# Patient Record
Sex: Female | Born: 1945 | Race: White | Hispanic: No | Marital: Single | Smoking: Never smoker
Health system: Southern US, Community
[De-identification: ages and names within clinical notes are randomized; demographics above are authoritative.]

## PROBLEM LIST (undated history)

## (undated) DIAGNOSIS — E039 Hypothyroidism, unspecified: Secondary | ICD-10-CM

## (undated) DIAGNOSIS — I10 Essential (primary) hypertension: Secondary | ICD-10-CM

## (undated) DIAGNOSIS — C801 Malignant (primary) neoplasm, unspecified: Secondary | ICD-10-CM

## (undated) HISTORY — PX: TONSILLECTOMY: SUR1361

## (undated) HISTORY — PX: OTHER SURGICAL HISTORY: SHX169

## (undated) HISTORY — PX: ABDOMINAL HYSTERECTOMY: SHX81

---

## 1988-11-03 HISTORY — PX: CHOLECYSTECTOMY: SHX55

## 1998-05-30 ENCOUNTER — Other Ambulatory Visit: Admission: RE | Admit: 1998-05-30 | Discharge: 1998-05-30 | Payer: Self-pay | Admitting: *Deleted

## 1998-06-04 ENCOUNTER — Inpatient Hospital Stay (HOSPITAL_COMMUNITY): Admission: RE | Admit: 1998-06-04 | Discharge: 1998-06-07 | Payer: Self-pay | Admitting: *Deleted

## 1998-10-03 ENCOUNTER — Other Ambulatory Visit: Admission: RE | Admit: 1998-10-03 | Discharge: 1998-10-03 | Payer: Self-pay | Admitting: *Deleted

## 1999-01-21 ENCOUNTER — Other Ambulatory Visit: Admission: RE | Admit: 1999-01-21 | Discharge: 1999-01-21 | Payer: Self-pay | Admitting: *Deleted

## 1999-04-11 ENCOUNTER — Other Ambulatory Visit: Admission: RE | Admit: 1999-04-11 | Discharge: 1999-04-11 | Payer: Self-pay | Admitting: *Deleted

## 1999-08-09 ENCOUNTER — Other Ambulatory Visit: Admission: RE | Admit: 1999-08-09 | Discharge: 1999-08-09 | Payer: Self-pay | Admitting: *Deleted

## 1999-09-10 ENCOUNTER — Encounter: Payer: Self-pay | Admitting: General Surgery

## 1999-09-12 ENCOUNTER — Observation Stay (HOSPITAL_COMMUNITY): Admission: RE | Admit: 1999-09-12 | Discharge: 1999-09-14 | Payer: Self-pay | Admitting: General Surgery

## 1999-12-25 ENCOUNTER — Other Ambulatory Visit: Admission: RE | Admit: 1999-12-25 | Discharge: 1999-12-25 | Payer: Self-pay | Admitting: *Deleted

## 2000-04-16 ENCOUNTER — Other Ambulatory Visit: Admission: RE | Admit: 2000-04-16 | Discharge: 2000-04-16 | Payer: Self-pay | Admitting: *Deleted

## 2000-05-25 ENCOUNTER — Encounter: Admission: RE | Admit: 2000-05-25 | Discharge: 2000-05-25 | Payer: Self-pay | Admitting: *Deleted

## 2000-08-20 ENCOUNTER — Other Ambulatory Visit: Admission: RE | Admit: 2000-08-20 | Discharge: 2000-08-20 | Payer: Self-pay | Admitting: *Deleted

## 2001-02-25 ENCOUNTER — Other Ambulatory Visit: Admission: RE | Admit: 2001-02-25 | Discharge: 2001-02-25 | Payer: Self-pay | Admitting: *Deleted

## 2001-05-26 ENCOUNTER — Encounter: Admission: RE | Admit: 2001-05-26 | Discharge: 2001-05-26 | Payer: Self-pay | Admitting: *Deleted

## 2001-08-30 ENCOUNTER — Other Ambulatory Visit: Admission: RE | Admit: 2001-08-30 | Discharge: 2001-08-30 | Payer: Self-pay | Admitting: *Deleted

## 2001-12-14 ENCOUNTER — Ambulatory Visit (HOSPITAL_COMMUNITY): Admission: RE | Admit: 2001-12-14 | Discharge: 2001-12-14 | Payer: Self-pay | Admitting: Family Medicine

## 2001-12-14 ENCOUNTER — Encounter: Payer: Self-pay | Admitting: Family Medicine

## 2002-02-23 ENCOUNTER — Other Ambulatory Visit: Admission: RE | Admit: 2002-02-23 | Discharge: 2002-02-23 | Payer: Self-pay | Admitting: *Deleted

## 2002-05-30 ENCOUNTER — Encounter: Admission: RE | Admit: 2002-05-30 | Discharge: 2002-05-30 | Payer: Self-pay | Admitting: *Deleted

## 2002-09-03 ENCOUNTER — Emergency Department (HOSPITAL_COMMUNITY): Admission: EM | Admit: 2002-09-03 | Discharge: 2002-09-03 | Payer: Self-pay | Admitting: Emergency Medicine

## 2002-09-03 ENCOUNTER — Encounter: Payer: Self-pay | Admitting: Internal Medicine

## 2002-09-12 ENCOUNTER — Ambulatory Visit (HOSPITAL_COMMUNITY): Admission: RE | Admit: 2002-09-12 | Discharge: 2002-09-12 | Payer: Self-pay | Admitting: Family Medicine

## 2002-09-12 ENCOUNTER — Encounter: Payer: Self-pay | Admitting: Family Medicine

## 2003-03-02 ENCOUNTER — Other Ambulatory Visit: Admission: RE | Admit: 2003-03-02 | Discharge: 2003-03-02 | Payer: Self-pay | Admitting: *Deleted

## 2003-06-01 ENCOUNTER — Encounter: Admission: RE | Admit: 2003-06-01 | Discharge: 2003-06-01 | Payer: Self-pay | Admitting: *Deleted

## 2003-12-08 ENCOUNTER — Emergency Department (HOSPITAL_COMMUNITY): Admission: EM | Admit: 2003-12-08 | Discharge: 2003-12-08 | Payer: Self-pay | Admitting: Emergency Medicine

## 2003-12-25 ENCOUNTER — Ambulatory Visit (HOSPITAL_COMMUNITY): Admission: RE | Admit: 2003-12-25 | Discharge: 2003-12-25 | Payer: Self-pay | Admitting: Internal Medicine

## 2004-03-05 ENCOUNTER — Other Ambulatory Visit: Admission: RE | Admit: 2004-03-05 | Discharge: 2004-03-05 | Payer: Self-pay | Admitting: *Deleted

## 2004-04-04 ENCOUNTER — Encounter: Admission: RE | Admit: 2004-04-04 | Discharge: 2004-04-04 | Payer: Self-pay | Admitting: General Surgery

## 2004-06-04 ENCOUNTER — Encounter: Admission: RE | Admit: 2004-06-04 | Discharge: 2004-06-04 | Payer: Self-pay | Admitting: *Deleted

## 2005-03-28 ENCOUNTER — Ambulatory Visit (HOSPITAL_COMMUNITY): Admission: RE | Admit: 2005-03-28 | Discharge: 2005-03-28 | Payer: Self-pay | Admitting: Family Medicine

## 2005-06-10 ENCOUNTER — Encounter: Admission: RE | Admit: 2005-06-10 | Discharge: 2005-06-10 | Payer: Self-pay | Admitting: *Deleted

## 2006-06-12 ENCOUNTER — Encounter: Admission: RE | Admit: 2006-06-12 | Discharge: 2006-06-12 | Payer: Self-pay | Admitting: Family Medicine

## 2006-11-09 ENCOUNTER — Ambulatory Visit (HOSPITAL_COMMUNITY): Admission: RE | Admit: 2006-11-09 | Discharge: 2006-11-09 | Payer: Self-pay | Admitting: Family Medicine

## 2007-06-14 ENCOUNTER — Encounter: Admission: RE | Admit: 2007-06-14 | Discharge: 2007-06-14 | Payer: Self-pay | Admitting: Family Medicine

## 2007-07-16 ENCOUNTER — Ambulatory Visit (HOSPITAL_COMMUNITY): Admission: RE | Admit: 2007-07-16 | Discharge: 2007-07-16 | Payer: Self-pay | Admitting: Family Medicine

## 2007-10-19 ENCOUNTER — Ambulatory Visit (HOSPITAL_COMMUNITY): Admission: RE | Admit: 2007-10-19 | Discharge: 2007-10-19 | Payer: Self-pay | Admitting: Family Medicine

## 2008-06-14 ENCOUNTER — Encounter: Admission: RE | Admit: 2008-06-14 | Discharge: 2008-06-14 | Payer: Self-pay | Admitting: Family Medicine

## 2008-07-18 ENCOUNTER — Ambulatory Visit (HOSPITAL_COMMUNITY): Admission: RE | Admit: 2008-07-18 | Discharge: 2008-07-18 | Payer: Self-pay | Admitting: Family Medicine

## 2009-01-11 ENCOUNTER — Encounter: Admission: RE | Admit: 2009-01-11 | Discharge: 2009-01-11 | Payer: Self-pay | Admitting: Orthopedic Surgery

## 2009-03-26 ENCOUNTER — Ambulatory Visit (HOSPITAL_COMMUNITY): Admission: RE | Admit: 2009-03-26 | Discharge: 2009-03-26 | Payer: Self-pay | Admitting: Family Medicine

## 2009-03-30 ENCOUNTER — Ambulatory Visit (HOSPITAL_COMMUNITY): Admission: RE | Admit: 2009-03-30 | Discharge: 2009-03-30 | Payer: Self-pay | Admitting: Family Medicine

## 2009-06-19 ENCOUNTER — Encounter: Admission: RE | Admit: 2009-06-19 | Discharge: 2009-06-19 | Payer: Self-pay | Admitting: Family Medicine

## 2010-07-04 ENCOUNTER — Encounter: Admission: RE | Admit: 2010-07-04 | Discharge: 2010-07-04 | Payer: Self-pay | Admitting: Family Medicine

## 2010-08-07 ENCOUNTER — Ambulatory Visit (HOSPITAL_COMMUNITY): Admission: RE | Admit: 2010-08-07 | Discharge: 2010-08-07 | Payer: Self-pay | Admitting: Family Medicine

## 2011-03-21 NOTE — Op Note (Signed)
NAME:  Megan Sutton, Megan Sutton                          ACCOUNT NO.:  [id number]   MEDICAL RECORD NO.:  [id number]                   PATIENT TYPE:  AMB   LOCATION:  DAY                                  FACILITY:  [facility]   PHYSICIAN:  Lionel December, M.D.                 DATE OF BIRTH:  03-25-1946   DATE OF PROCEDURE:  12/25/2003  DATE OF DISCHARGE:                                 OPERATIVE REPORT   PROCEDURE:  Total colonoscopy.   ENDOSCOPIST:  Lionel December, M.D.   INDICATIONS:  This patient is a 65 year old Caucasian female who is here for  screening colonoscopy.  Family history is negative for colorectal carcinoma.  The procedure and risks were reviewed with the patient and informed consent  was obtained.   PREOPERATIVE MEDICATIONS:  Demerol 50 mg IV and Versed 5 mg IV in divided  dose.   FINDINGS:  Procedure performed in endoscopy suite.  The patient's vital  signs and O2 saturation were monitored during the procedure and remained  stable.  The patient was placed in the left lateral recumbent position and  rectal examination was performed.  There was a small tag noted on digital  exam.   Olympus videoscope was placed in the rectum and advanced under vision into  the sigmoid colon and beyond.  Preparation was satisfactory.  Scope was  passed into the cecum which was identified by appendiceal orifice/stump and  ileocecal valve.  Pictures were taken for the record.  As the scope was  withdrawn colonic mucosa was carefully examined.  There was a single small  diverticulum at splenic flexure.  The rest of the mucosa was normal.  Rectal  mucosa similarly was normal.   The scope was retroflexed to examine the anorectal junction and she had 1  anal papilla which was also palpated on digital examination.  Endoscope was  straightened and withdrawn.  The patient tolerated the procedure well.   FINAL DIAGNOSIS:  A single small diverticulum at splenic flexure and  prominent anal papilla,  otherwise normal colonoscopy.   RECOMMENDATIONS:  1. High fiber diet.  2. Yearly Hemoccults.  3. She may consider next screening exam in 10 years from now.      ___________________________________________                                            Lionel December, M.D.   NR/MEDQ  D:  12/25/2003  T:  12/25/2003  Job:  [id number]   cc:   Patrica Duel, M.D.  [address], [address]  [place]  [place] [address]  Fax: [phone]

## 2011-06-23 ENCOUNTER — Other Ambulatory Visit: Payer: Self-pay | Admitting: Physician Assistant

## 2011-06-23 DIAGNOSIS — Z1231 Encounter for screening mammogram for malignant neoplasm of breast: Secondary | ICD-10-CM

## 2011-07-08 ENCOUNTER — Ambulatory Visit
Admission: RE | Admit: 2011-07-08 | Discharge: 2011-07-08 | Disposition: A | Discharge status: Home / Self Care | Payer: PRIVATE HEALTH INSURANCE | Source: Ambulatory Visit | Attending: Physician Assistant | Admitting: Physician Assistant

## 2011-07-08 DIAGNOSIS — Z1231 Encounter for screening mammogram for malignant neoplasm of breast: Secondary | ICD-10-CM

## 2011-11-19 ENCOUNTER — Other Ambulatory Visit (HOSPITAL_COMMUNITY): Payer: Self-pay | Admitting: Physician Assistant

## 2011-11-19 ENCOUNTER — Ambulatory Visit (HOSPITAL_COMMUNITY)
Admission: RE | Admit: 2011-11-19 | Discharge: 2011-11-19 | Disposition: A | Discharge status: Home / Self Care | Payer: BC Managed Care – PPO | Source: Ambulatory Visit | Attending: Physician Assistant | Admitting: Physician Assistant

## 2011-11-19 DIAGNOSIS — E039 Hypothyroidism, unspecified: Secondary | ICD-10-CM

## 2011-11-19 DIAGNOSIS — E049 Nontoxic goiter, unspecified: Secondary | ICD-10-CM | POA: Insufficient documentation

## 2012-06-07 ENCOUNTER — Other Ambulatory Visit: Payer: Self-pay | Admitting: Family Medicine

## 2012-06-07 DIAGNOSIS — Z1231 Encounter for screening mammogram for malignant neoplasm of breast: Secondary | ICD-10-CM

## 2012-07-08 ENCOUNTER — Ambulatory Visit
Admission: RE | Admit: 2012-07-08 | Discharge: 2012-07-08 | Disposition: A | Discharge status: Home / Self Care | Payer: Medicare Other | Source: Ambulatory Visit | Attending: Family Medicine | Admitting: Family Medicine

## 2012-07-08 DIAGNOSIS — Z1231 Encounter for screening mammogram for malignant neoplasm of breast: Secondary | ICD-10-CM

## 2012-08-18 ENCOUNTER — Other Ambulatory Visit (HOSPITAL_COMMUNITY): Payer: Self-pay | Admitting: Physician Assistant

## 2012-08-18 DIAGNOSIS — Z01419 Encounter for gynecological examination (general) (routine) without abnormal findings: Secondary | ICD-10-CM

## 2012-08-18 DIAGNOSIS — Z139 Encounter for screening, unspecified: Secondary | ICD-10-CM

## 2012-08-19 ENCOUNTER — Ambulatory Visit (HOSPITAL_COMMUNITY)
Admission: RE | Admit: 2012-08-19 | Discharge: 2012-08-19 | Disposition: A | Discharge status: Home / Self Care | Payer: Medicare Other | Source: Ambulatory Visit | Attending: Physician Assistant | Admitting: Physician Assistant

## 2012-08-19 DIAGNOSIS — Z780 Asymptomatic menopausal state: Secondary | ICD-10-CM | POA: Insufficient documentation

## 2012-08-19 DIAGNOSIS — Z9079 Acquired absence of other genital organ(s): Secondary | ICD-10-CM | POA: Insufficient documentation

## 2012-08-19 DIAGNOSIS — Z139 Encounter for screening, unspecified: Secondary | ICD-10-CM

## 2012-08-19 DIAGNOSIS — Z01419 Encounter for gynecological examination (general) (routine) without abnormal findings: Secondary | ICD-10-CM | POA: Insufficient documentation

## 2012-09-06 IMAGING — US US SOFT TISSUE HEAD/NECK
1 series · 14 of 25 positions shown · non-contrast
Comparison: None.

CLINICAL DATA: Palpable thyroid nodule on the right.

THYROID ULTRASOUND
TECHNIQUE: Ultrasound examination of the thyroid gland and adjacent
soft tissues was performed.

[Series 1: us soft tissue head/neck · 0.07mm/px · 14 of 32 slices shown]
[im 1/32]
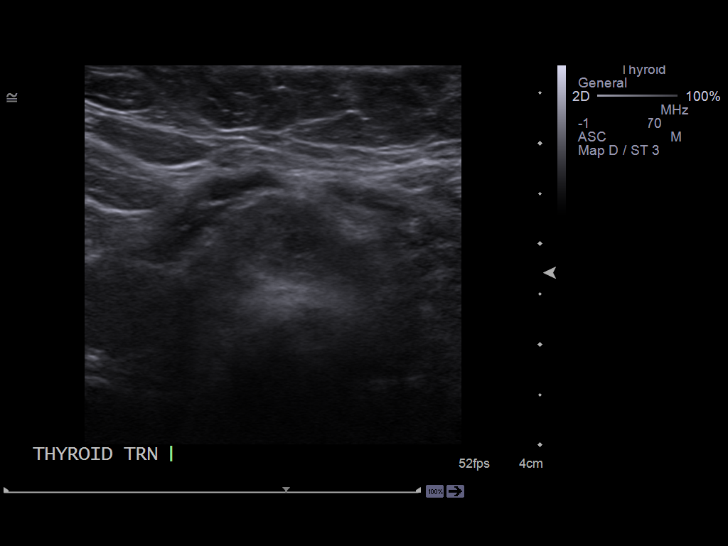
[im 3/32]
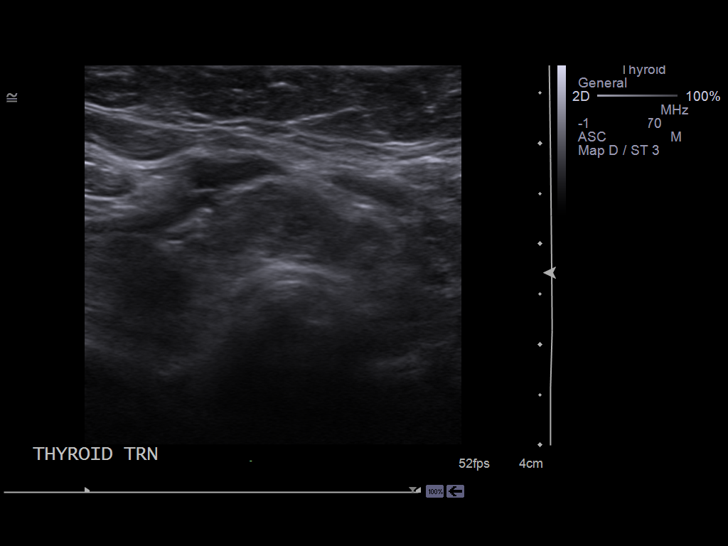
[im 6/32]
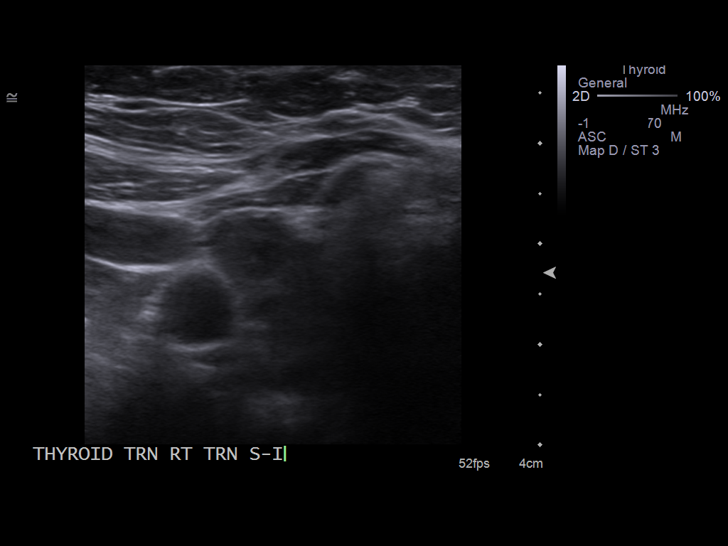
[im 8/32]
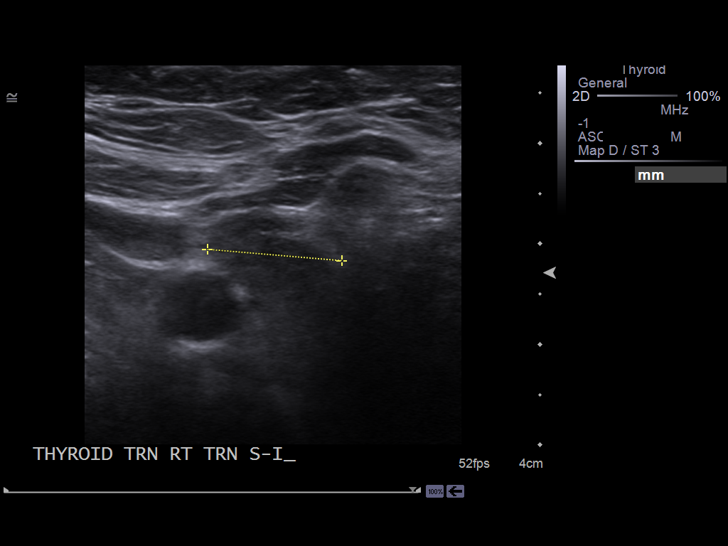
[im 11/32]
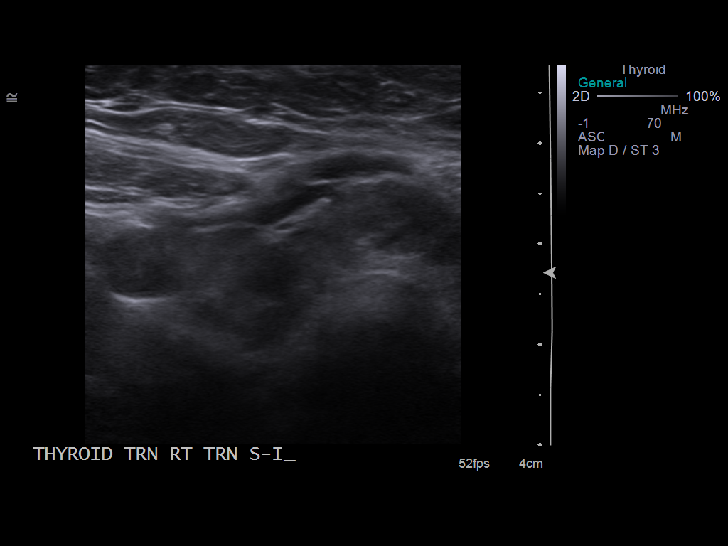
[im 12/32]
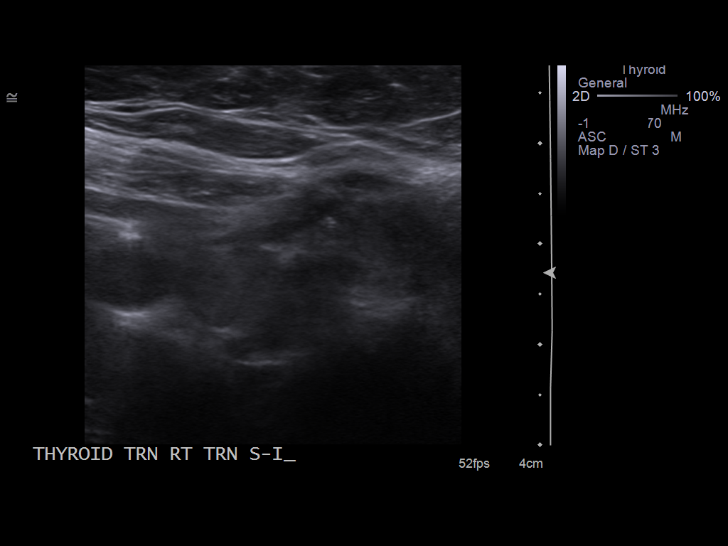
[im 15/32]
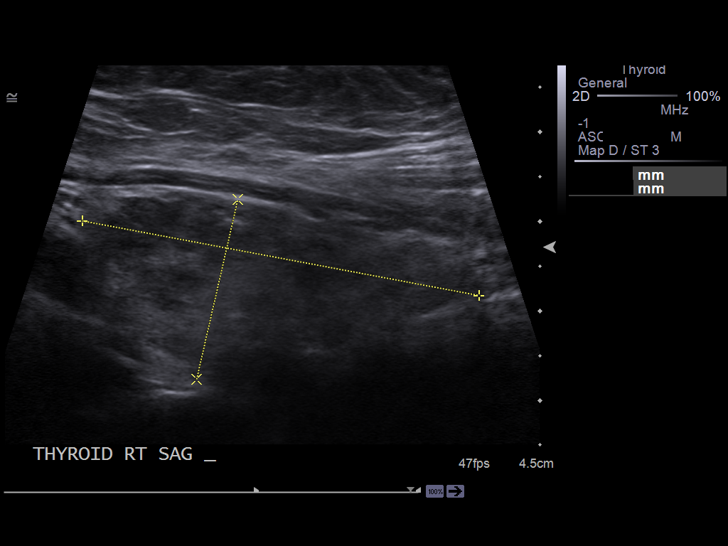
[im 17/32]
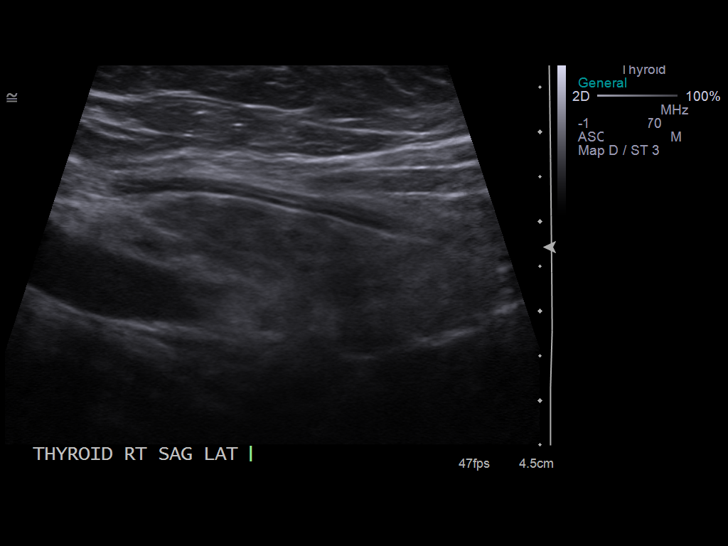
[im 20/32]
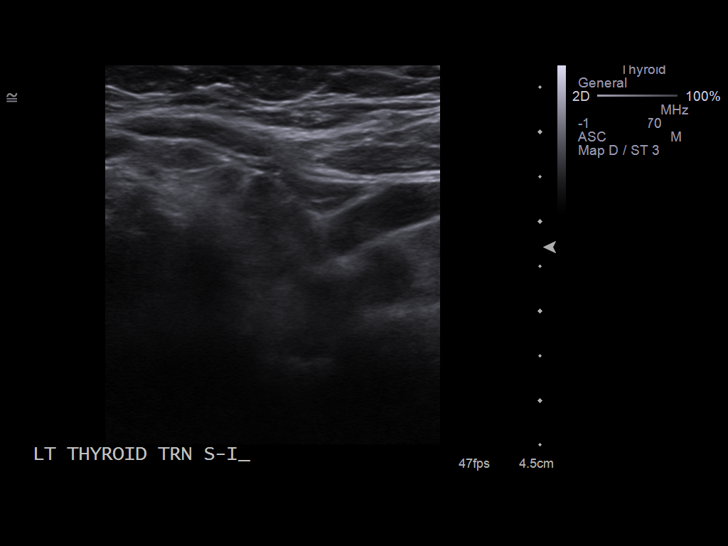
[im 21/32]
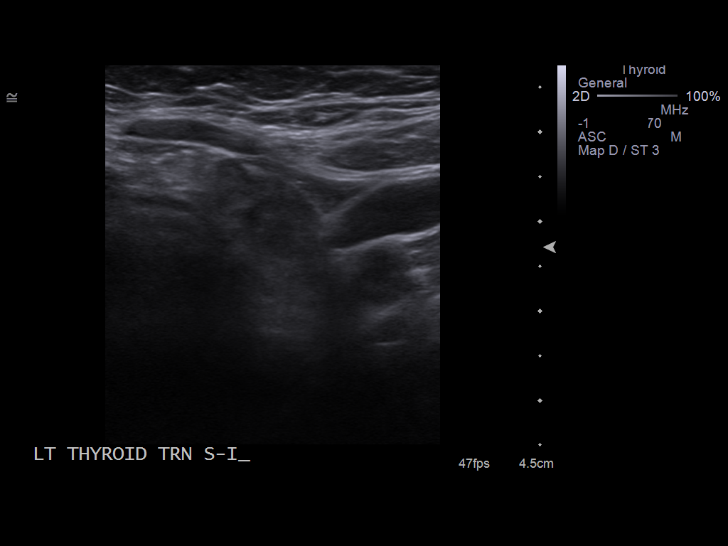
[im 24/32]
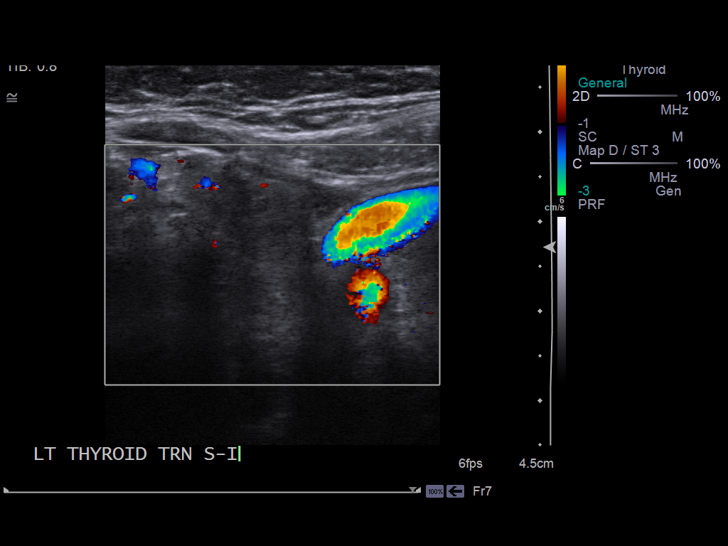
[im 26/32]
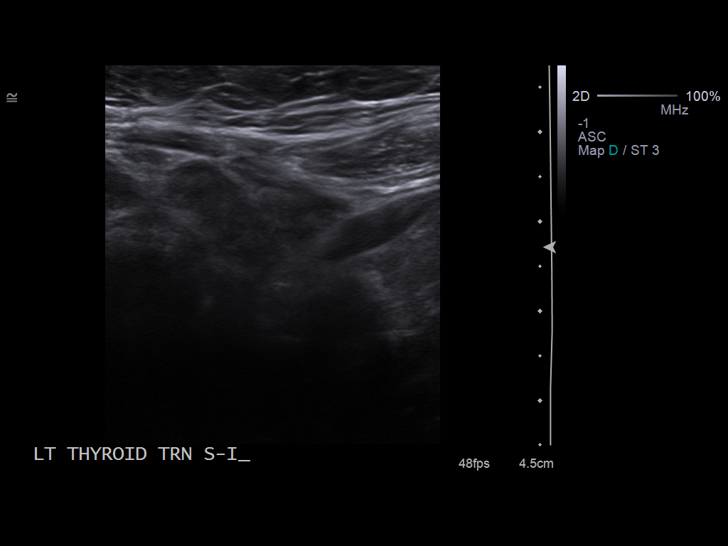
[im 29/32]
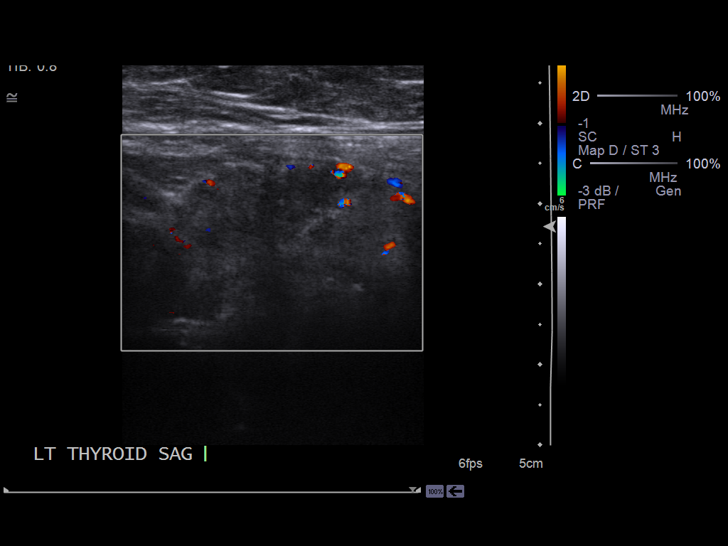
[im 32/32]
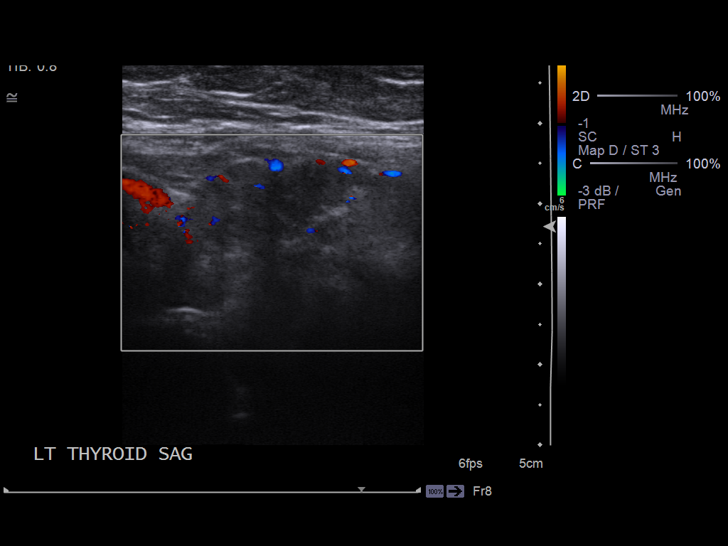

[14 of 25 positions shown; findings below may reference images not displayed]

FINDINGS: Right thyroid lobe:  Measures 4.5 x 2.1 x 1.3 cm.
Left thyroid lobe:  Measures 3.8 x 1.9 in by 2.0 cm.
Isthmus:  Measures 0.9 cm in thickness.

Focal nodules:  None.  The thyroid gland is diffusely and markedly
heterogeneous.

Lymphadenopathy:  None visualized.
IMPRESSION: Negative for focal nodule.  The thyroid is markedly heterogeneous.

## 2013-07-08 ENCOUNTER — Other Ambulatory Visit: Payer: Self-pay

## 2013-07-08 DIAGNOSIS — Z1231 Encounter for screening mammogram for malignant neoplasm of breast: Secondary | ICD-10-CM

## 2013-08-01 ENCOUNTER — Ambulatory Visit
Admission: RE | Admit: 2013-08-01 | Discharge: 2013-08-01 | Disposition: A | Discharge status: Home / Self Care | Payer: Medicare Other | Source: Ambulatory Visit

## 2013-08-01 DIAGNOSIS — Z1231 Encounter for screening mammogram for malignant neoplasm of breast: Secondary | ICD-10-CM

## 2014-06-26 ENCOUNTER — Other Ambulatory Visit: Payer: Self-pay

## 2014-06-26 DIAGNOSIS — Z1231 Encounter for screening mammogram for malignant neoplasm of breast: Secondary | ICD-10-CM

## 2014-08-02 ENCOUNTER — Ambulatory Visit
Admission: RE | Admit: 2014-08-02 | Discharge: 2014-08-02 | Disposition: A | Discharge status: Home / Self Care | Payer: Commercial Managed Care - HMO | Source: Ambulatory Visit

## 2014-08-02 DIAGNOSIS — Z1231 Encounter for screening mammogram for malignant neoplasm of breast: Secondary | ICD-10-CM

## 2016-01-31 DIAGNOSIS — H52223 Regular astigmatism, bilateral: Secondary | ICD-10-CM | POA: Diagnosis not present

## 2016-01-31 DIAGNOSIS — H04123 Dry eye syndrome of bilateral lacrimal glands: Secondary | ICD-10-CM | POA: Diagnosis not present

## 2016-01-31 DIAGNOSIS — H25813 Combined forms of age-related cataract, bilateral: Secondary | ICD-10-CM | POA: Diagnosis not present

## 2016-01-31 DIAGNOSIS — H5211 Myopia, right eye: Secondary | ICD-10-CM | POA: Diagnosis not present

## 2016-04-03 DIAGNOSIS — L138 Other specified bullous disorders: Secondary | ICD-10-CM | POA: Diagnosis not present

## 2016-04-03 DIAGNOSIS — Z79899 Other long term (current) drug therapy: Secondary | ICD-10-CM | POA: Diagnosis not present

## 2016-04-08 ENCOUNTER — Other Ambulatory Visit: Payer: Self-pay | Admitting: Family Medicine

## 2016-04-08 DIAGNOSIS — Z1231 Encounter for screening mammogram for malignant neoplasm of breast: Secondary | ICD-10-CM

## 2016-04-21 ENCOUNTER — Ambulatory Visit
Admission: RE | Admit: 2016-04-21 | Discharge: 2016-04-21 | Disposition: A | Discharge status: Home / Self Care | Payer: PPO | Source: Ambulatory Visit | Attending: Family Medicine | Admitting: Family Medicine

## 2016-04-21 DIAGNOSIS — Z1231 Encounter for screening mammogram for malignant neoplasm of breast: Secondary | ICD-10-CM | POA: Diagnosis not present

## 2016-04-22 ENCOUNTER — Telehealth: Payer: Self-pay

## 2016-04-22 NOTE — Telephone Encounter (Signed)
PATIENT DUE FOR 10 YR TCS   LAST ONE DONE 10 YRS AGO WITH REHMAN.  WANTS DR Gala Romney.  PLEASE CALL, [phone]

## 2016-04-24 ENCOUNTER — Telehealth: Payer: Self-pay

## 2016-04-24 DIAGNOSIS — L138 Other specified bullous disorders: Secondary | ICD-10-CM | POA: Diagnosis not present

## 2016-04-24 DIAGNOSIS — Z79899 Other long term (current) drug therapy: Secondary | ICD-10-CM | POA: Diagnosis not present

## 2016-04-24 NOTE — Telephone Encounter (Signed)
See separate triage.  

## 2016-04-24 NOTE — Telephone Encounter (Signed)
LMOM for a return call.  

## 2016-04-24 NOTE — Telephone Encounter (Signed)
Gastroenterology Pre-Procedure Review  Request Date: 04/24/2016 Requesting Physician: ON RECALL FOR 2015  PATIENT REVIEW QUESTIONS: The patient responded to the following health history questions as indicated:    Pt's last colonoscopy was in 2005 by Dr. Laural Golden and pt was sent a reminder letter in 11/2013 She would like to stay with our office and have Dr. Gala Romney do her colonoscopy  1. Diabetes Melitis: no 2. Joint replacements in the past 12 months: no 3. Major health problems in the past 3 months: no 4. Has an artificial valve or MVP: no 5. Has a defibrillator: no 6. Has been advised in past to take antibiotics in advance of a procedure like teeth cleaning: no 7. Family history of colon cancer: no  8. Alcohol Use: no 9. History of sleep apnea: no     MEDICATIONS & ALLERGIES:    Patient reports the following regarding taking any blood thinners:   Plavix? no Aspirin? no Coumadin? no  Patient confirms/reports the following medications:  Current Outpatient Prescriptions  Medication Sig Dispense Refill  . levothyroxine (SYNTHROID, LEVOTHROID) 75 MCG tablet Take 75 mcg by mouth daily before breakfast.    . polyethylene glycol (MIRALAX / GLYCOLAX) packet Take 17 g by mouth daily. Pt takes a dose every other day and is doing well with her bowel movements    . ramipril (ALTACE) 5 MG capsule Take 5 mg by mouth daily.     No current facility-administered medications for this visit.    Patient confirms/reports the following allergies:  No Known Allergies  No orders of the defined types were placed in this encounter.    AUTHORIZATION INFORMATION Primary Insurance:   ID #:   Group #:  Pre-Cert / Auth required:  Pre-Cert / Auth #:   Secondary Insurance:   ID #:  Group #:  Pre-Cert / Auth required: Pre-Cert / Auth #:   SCHEDULE INFORMATION: Procedure has been scheduled as follows:  Date:  05/13/2016            Time:  11:30 AM Location: [facility] Short Stay  This  Gastroenterology Pre-Precedure Review Form is being routed to the following provider(s): R. Garfield Cornea, MD

## 2016-04-24 NOTE — Telephone Encounter (Signed)
Ok to schedule.

## 2016-04-29 DIAGNOSIS — Z6841 Body Mass Index (BMI) 40.0 and over, adult: Secondary | ICD-10-CM | POA: Diagnosis not present

## 2016-04-29 DIAGNOSIS — M5410 Radiculopathy, site unspecified: Secondary | ICD-10-CM | POA: Diagnosis not present

## 2016-04-29 DIAGNOSIS — Z1389 Encounter for screening for other disorder: Secondary | ICD-10-CM | POA: Diagnosis not present

## 2016-04-29 DIAGNOSIS — M545 Low back pain: Secondary | ICD-10-CM | POA: Diagnosis not present

## 2016-04-30 ENCOUNTER — Other Ambulatory Visit: Payer: Self-pay

## 2016-04-30 DIAGNOSIS — Z1211 Encounter for screening for malignant neoplasm of colon: Secondary | ICD-10-CM

## 2016-04-30 MED ORDER — PEG 3350-KCL-NA BICARB-NACL 420 G PO SOLR: 4000.0000 mL | ORAL | Status: AC

## 2016-04-30 NOTE — Telephone Encounter (Signed)
Rx sent to the pharmacy and instructions mailed to pt.  

## 2016-05-01 ENCOUNTER — Telehealth: Payer: Self-pay

## 2016-05-01 DIAGNOSIS — L138 Other specified bullous disorders: Secondary | ICD-10-CM | POA: Diagnosis not present

## 2016-05-01 DIAGNOSIS — Z79899 Other long term (current) drug therapy: Secondary | ICD-10-CM | POA: Diagnosis not present

## 2016-05-01 NOTE — Telephone Encounter (Signed)
Paperwork to be scanned in epic for the NO PA REQUIRED FOR THE SCREENING COLONOSCOPY.

## 2016-05-08 DIAGNOSIS — L138 Other specified bullous disorders: Secondary | ICD-10-CM | POA: Diagnosis not present

## 2016-05-08 DIAGNOSIS — Z79899 Other long term (current) drug therapy: Secondary | ICD-10-CM | POA: Diagnosis not present

## 2016-05-13 ENCOUNTER — Encounter (HOSPITAL_COMMUNITY): Payer: Self-pay | Admitting: *Deleted

## 2016-05-13 ENCOUNTER — Encounter (HOSPITAL_COMMUNITY)
Admission: RE | Disposition: A | Discharge status: Home / Self Care | Payer: Self-pay | Source: Ambulatory Visit | Attending: Internal Medicine

## 2016-05-13 ENCOUNTER — Ambulatory Visit (HOSPITAL_COMMUNITY)
Admission: RE | Admit: 2016-05-13 | Discharge: 2016-05-13 | Disposition: A | Discharge status: Home / Self Care | Payer: PPO | Source: Ambulatory Visit | Attending: Internal Medicine | Admitting: Internal Medicine

## 2016-05-13 DIAGNOSIS — I10 Essential (primary) hypertension: Secondary | ICD-10-CM | POA: Diagnosis not present

## 2016-05-13 DIAGNOSIS — Z8542 Personal history of malignant neoplasm of other parts of uterus: Secondary | ICD-10-CM | POA: Diagnosis not present

## 2016-05-13 DIAGNOSIS — E039 Hypothyroidism, unspecified: Secondary | ICD-10-CM | POA: Insufficient documentation

## 2016-05-13 DIAGNOSIS — K5730 Diverticulosis of large intestine without perforation or abscess without bleeding: Secondary | ICD-10-CM | POA: Diagnosis not present

## 2016-05-13 DIAGNOSIS — Z1211 Encounter for screening for malignant neoplasm of colon: Secondary | ICD-10-CM | POA: Diagnosis not present

## 2016-05-13 DIAGNOSIS — Z79899 Other long term (current) drug therapy: Secondary | ICD-10-CM | POA: Insufficient documentation

## 2016-05-13 HISTORY — DX: Hypothyroidism, unspecified: E03.9

## 2016-05-13 HISTORY — PX: COLONOSCOPY: SHX5424

## 2016-05-13 HISTORY — DX: Malignant (primary) neoplasm, unspecified: C80.1

## 2016-05-13 HISTORY — DX: Essential (primary) hypertension: I10

## 2016-05-13 SURGERY — COLONOSCOPY
Anesthesia: Moderate Sedation

## 2016-05-13 MED ORDER — MIDAZOLAM HCL 5 MG/5ML IJ SOLN
INTRAMUSCULAR | Status: DC | PRN
  Administered 2016-05-13: 1 mg via INTRAVENOUS
  Administered 2016-05-13: 2 mg via INTRAVENOUS
  Administered 2016-05-13: 0.5 mg via INTRAVENOUS
  Administered 2016-05-13: 2 mg via INTRAVENOUS

## 2016-05-13 MED ORDER — MEPERIDINE HCL 100 MG/ML IJ SOLN
INTRAMUSCULAR | Status: DC
Start: 2016-05-13 — End: 2016-05-13
  Filled 2016-05-13: qty 2

## 2016-05-13 MED ORDER — MIDAZOLAM HCL 5 MG/5ML IJ SOLN
INTRAMUSCULAR | Status: AC
  Filled 2016-05-13: qty 10

## 2016-05-13 MED ORDER — ONDANSETRON HCL 4 MG/2ML IJ SOLN
INTRAMUSCULAR | Status: AC
  Filled 2016-05-13: qty 2

## 2016-05-13 MED ORDER — SODIUM CHLORIDE 0.9 % IV SOLN
INTRAVENOUS | Status: DC
  Administered 2016-05-13: 11:00:00 via INTRAVENOUS

## 2016-05-13 MED ORDER — MEPERIDINE HCL 100 MG/ML IJ SOLN
INTRAMUSCULAR | Status: DC | PRN
  Administered 2016-05-13 (×2): 50 mg via INTRAVENOUS
  Administered 2016-05-13: 25 mg via INTRAVENOUS

## 2016-05-13 MED ORDER — ONDANSETRON HCL 4 MG/2ML IJ SOLN
INTRAMUSCULAR | Status: DC | PRN
  Administered 2016-05-13: 4 mg via INTRAVENOUS

## 2016-05-13 NOTE — Discharge Instructions (Signed)
Colonoscopy Discharge Instructions  Read the instructions outlined below and refer to this sheet in the next few weeks. These discharge instructions provide you with general information on caring for yourself after you leave the hospital. Your doctor may also give you specific instructions. While your treatment has been planned according to the most current medical practices available, unavoidable complications occasionally occur. If you have any problems or questions after discharge, call Dr. Gala Romney at [phone]. ACTIVITY  You may resume your regular activity, but move at a slower pace for the next 24 hours.   Take frequent rest periods for the next 24 hours.   Walking will help get rid of the air and reduce the bloated feeling in your belly (abdomen).   No driving for 24 hours (because of the medicine (anesthesia) used during the test).    Do not sign any important legal documents or operate any machinery for 24 hours (because of the anesthesia used during the test).  NUTRITION  Drink plenty of fluids.   You may resume your normal diet as instructed by your doctor.   Begin with a light meal and progress to your normal diet. Heavy or fried foods are harder to digest and may make you feel sick to your stomach (nauseated).   Avoid alcoholic beverages for 24 hours or as instructed.  MEDICATIONS  You may resume your normal medications unless your doctor tells you otherwise.  WHAT YOU CAN EXPECT TODAY  Some feelings of bloating in the abdomen.   Passage of more gas than usual.   Spotting of blood in your stool or on the toilet paper.  IF YOU HAD POLYPS REMOVED DURING THE COLONOSCOPY:  No aspirin products for 7 days or as instructed.   No alcohol for 7 days or as instructed.   Eat a soft diet for the next 24 hours.  FINDING OUT THE RESULTS OF YOUR TEST Not all test results are available during your visit. If your test results are not back during the visit, make an appointment  with your caregiver to find out the results. Do not assume everything is normal if you have not heard from your caregiver or the medical facility. It is important for you to follow up on all of your test results.  SEEK IMMEDIATE MEDICAL ATTENTION IF:  You have more than a spotting of blood in your stool.   Your belly is swollen (abdominal distention).   You are nauseated or vomiting.   You have a temperature over 101.   You have abdominal pain or discomfort that is severe or gets worse throughout the day.    Diverticulosis information provided  1 more screening colonoscopy in 10 years only of overall health permits Diverticulosis Diverticulosis is the condition that develops when small pouches (diverticula) form in the wall of your colon. Your colon, or large intestine, is where water is absorbed and stool is formed. The pouches form when the inside layer of your colon pushes through weak spots in the outer layers of your colon. CAUSES  No one knows exactly what causes diverticulosis. RISK FACTORS  Being older than 69. Your risk for this condition increases with age. Diverticulosis is rare in people younger than 40 years. By age 69, almost everyone has it.  Eating a low-fiber diet.  Being frequently constipated.  Being overweight.  Not getting enough exercise.  Smoking.  Taking over-the-counter pain medicines, like aspirin and ibuprofen. SYMPTOMS  Most people with diverticulosis do not have symptoms. DIAGNOSIS  Because  diverticulosis often has no symptoms, health care providers often discover the condition during an exam for other colon problems. In many cases, a health care provider will diagnose diverticulosis while using a flexible scope to examine the colon (colonoscopy). TREATMENT  If you have never developed an infection related to diverticulosis, you may not need treatment. If you have had an infection before, treatment may include:  Eating more fruits, vegetables,  and grains.  Taking a fiber supplement.  Taking a live bacteria supplement (probiotic).  Taking medicine to relax your colon. HOME CARE INSTRUCTIONS   Drink at least 6-8 glasses of water each day to prevent constipation.  Try not to strain when you have a bowel movement.  Keep all follow-up appointments. If you have had an infection before:  Increase the fiber in your diet as directed by your health care provider or dietitian.  Take a dietary fiber supplement if your health care provider approves.  Only take medicines as directed by your health care provider. SEEK MEDICAL CARE IF:   You have abdominal pain.  You have bloating.  You have cramps.  You have not gone to the bathroom in 3 days. SEEK IMMEDIATE MEDICAL CARE IF:   Your pain gets worse.  Yourbloating becomes very bad.  You have a fever or chills, and your symptoms suddenly get worse.  You begin vomiting.  You have bowel movements that are bloody or black. MAKE SURE YOU:  Understand these instructions.  Will watch your condition.  Will get help right away if you are not doing well or get worse.   This information is not intended to replace advice given to you by your health care provider. Make sure you discuss any questions you have with your health care provider.   Document Released: 07/17/2004 Document Revised: 10/25/2013 Document Reviewed: 09/14/2013 Elsevier Interactive Patient Education 2016 Elsevier Inc.  High-Fiber Diet Fiber, also called dietary fiber, is a type of carbohydrate found in fruits, vegetables, whole grains, and beans. A high-fiber diet can have many health benefits. Your health care provider may recommend a high-fiber diet to help:  Prevent constipation. Fiber can make your bowel movements more regular.  Lower your cholesterol.  Relieve hemorrhoids, uncomplicated diverticulosis, or irritable bowel syndrome.  Prevent overeating as part of a weight-loss plan.  Prevent heart  disease, type 2 diabetes, and certain cancers. WHAT IS MY PLAN? The recommended daily intake of fiber includes:  38 grams for men under age 63.  11 grams for men over age 54.  46 grams for women under age 81.  68 grams for women over age 1. You can get the recommended daily intake of dietary fiber by eating a variety of fruits, vegetables, grains, and beans. Your health care provider may also recommend a fiber supplement if it is not possible to get enough fiber through your diet. WHAT DO I NEED TO KNOW ABOUT A HIGH-FIBER DIET?  Fiber supplements have not been widely studied for their effectiveness, so it is better to get fiber through food sources.  Always check the fiber content on thenutrition facts label of any prepackaged food. Look for foods that contain at least 5 grams of fiber per serving.  Ask your dietitian if you have questions about specific foods that are related to your condition, especially if those foods are not listed in the following section.  Increase your daily fiber consumption gradually. Increasing your intake of dietary fiber too quickly may cause bloating, cramping, or gas.  Drink plenty of  water. Water helps you to digest fiber. WHAT FOODS CAN I EAT? Grains Whole-grain breads. Multigrain cereal. Oats and oatmeal. Brown rice. Barley. Bulgur wheat. [place]. Bran muffins. Popcorn. Rye wafer crackers. Vegetables Sweet potatoes. Spinach. Kale. Artichokes. Cabbage. Broccoli. Green peas. Carrots. Squash. Fruits Berries. Pears. Apples. Oranges. Avocados. Prunes and raisins. Dried figs. Meats and Other Protein Sources [place], kidney, pinto, and soy beans. Split peas. Lentils. Nuts and seeds. Dairy Fiber-fortified yogurt. Beverages Fiber-fortified soy milk. Fiber-fortified orange juice. Other Fiber bars. The items listed above may not be a complete list of recommended foods or beverages. Contact your dietitian for more options. WHAT FOODS ARE NOT  RECOMMENDED? Grains White bread. Pasta made with refined flour. White rice. Vegetables Fried potatoes. Canned vegetables. Well-cooked vegetables.  Fruits Fruit juice. Cooked, strained fruit. Meats and Other Protein Sources Fatty cuts of meat. Fried Sales executive or fried fish. Dairy Milk. Yogurt. Cream cheese. Sour cream. Beverages Soft drinks. Other Cakes and pastries. Butter and oils. The items listed above may not be a complete list of foods and beverages to avoid. Contact your dietitian for more information. WHAT ARE SOME TIPS FOR INCLUDING HIGH-FIBER FOODS IN MY DIET?  Eat a wide variety of high-fiber foods.  Make sure that half of all grains consumed each day are whole grains.  Replace breads and cereals made from refined flour or white flour with whole-grain breads and cereals.  Replace white rice with brown rice, bulgur wheat, or millet.  Start the day with a breakfast that is high in fiber, such as a cereal that contains at least 5 grams of fiber per serving.  Use beans in place of meat in soups, salads, or pasta.  Eat high-fiber snacks, such as berries, raw vegetables, nuts, or popcorn.   This information is not intended to replace advice given to you by your health care provider. Make sure you discuss any questions you have with your health care provider.   Document Released: 10/20/2005 Document Revised: 11/10/2014 Document Reviewed: 04/04/2014 Elsevier Interactive Patient Education [employer].

## 2016-05-13 NOTE — H&P (Signed)
@  [id number]   Primary Care Physician:  Purvis Kilts, MD Primary Gastroenterologist:  Dr. Gala Romney  Pre-Procedure History & Physical: HPI:  Megan Sutton is a 70 y.o. female is here for a screening colonoscopy. No bowel symptoms. Reported negative colonoscopy 2005-records unavailable. No family history of colon cancer.  Past Medical History  Diagnosis Date  . Hypertension   . Hypothyroidism   . Cancer [facility])     Uterine cancer    Past Surgical History  Procedure Laterality Date  . Abdominal hysterectomy    . Cholecystectomy  1990  . Tonsillectomy    . Left inguinal hernia repair      Prior to Admission medications   Medication Sig Start Date End Date Taking? Authorizing Provider  dapsone 25 MG tablet Take 25 mg by mouth daily.  04/17/16  Yes Historical Provider, MD  levothyroxine (SYNTHROID, LEVOTHROID) 75 MCG tablet Take 75 mcg by mouth daily before breakfast.   Yes Historical Provider, MD  polyethylene glycol (MIRALAX / GLYCOLAX) packet Take 17 g by mouth daily. Pt takes a dose every other day and is doing well with her bowel movements   Yes Historical Provider, MD  polyethylene glycol-electrolytes (TRILYTE) 420 g solution Take 4,000 mLs by mouth as directed. 04/30/16  Yes Daneil Dolin, MD  ramipril (ALTACE) 5 MG capsule Take 5 mg by mouth daily.   Yes Historical Provider, MD  clobetasol cream (TEMOVATE) [id number] % Apply 1 application topically 2 (two) times daily.  04/03/16   Historical Provider, MD    Allergies as of 04/30/2016  . (No Known Allergies)    Family History  Problem Relation Age of Onset  . Adopted: Yes    Social History   Social History  . Marital Status: Single    Spouse Name: N/A  . Number of Children: N/A  . Years of Education: N/A   Occupational History  . Not on file.   Social History Main Topics  . Smoking status: Never Smoker   . Smokeless tobacco: Not on file  . Alcohol Use: No  . Drug Use: No  . Sexual Activity: Not on file   Other  Topics Concern  . Not on file   Social History Narrative  . No narrative on file    Review of Systems: See HPI, otherwise negative ROS  Physical Exam: BP 136/98 mmHg  Pulse 86  Temp(Src) 97.5 F (36.4 C) (Oral)  Resp 18  Ht 5' 2.5" (1.588 m)  Wt 288 lb (130.636 kg)  BMI 51.80 kg/m2  SpO2 92% General:   Alert,  Well-developed, well-nourished, pleasant and cooperative in NAD Head:  Normocephalic and atraumatic. Lungs:  Clear throughout to auscultation.   No wheezes, crackles, or rhonchi. No acute distress. Heart:  Regular rate and rhythm; no murmurs, clicks, rubs,  or gallops. Abdomen:  Soft, nontender and nondistended. No masses, hepatosplenomegaly or hernias noted. Normal bowel sounds, without guarding, and without rebound.    Impression/Plan: Megan Sutton is now here to undergo a screening colonoscopy.  Average risk screening examination.  Risks, benefits, limitations, imponderables and alternatives regarding colonoscopy have been reviewed with the patient. Questions have been answered. All parties agreeable.     Notice:  This dictation was prepared with Dragon dictation along with smaller phrase technology. Any transcriptional errors that result from this process are unintentional and may not be corrected upon review.

## 2016-05-13 NOTE — Op Note (Signed)
[facility] Patient Name: Megan Sutton Procedure Date: 05/13/2016 10:29 AM MRN: [id number] Date of Birth: 08/05/1946 Attending MD: Norvel Richards , MD CSN: [id number] Age: 70 Admit Type: Outpatient Procedure:                Colonoscopy - screening Indications:              Screening for colorectal malignant neoplasm Providers:                Norvel Richards, MD, Lurline Del, RN, Randa Spike, Technician Referring MD:              Medicines:                Midazolam 6.5 mg IV, Meperidine 125 mg IV,                            Ondansetron 4 mg IV Complications:            No immediate complications. Estimated Blood Loss:     Estimated blood loss: none. Procedure:                Pre-Anesthesia Assessment:                           - Prior to the procedure, a History and Physical                            was performed, and patient medications and                            allergies were reviewed. The patient's tolerance of                            previous anesthesia was also reviewed. The risks                            and benefits of the procedure and the sedation                            options and risks were discussed with the patient.                            All questions were answered, and informed consent                            was obtained. Prior Anticoagulants: The patient has                            taken no previous anticoagulant or antiplatelet                            agents. ASA Grade Assessment: II - A patient with  mild systemic disease. After reviewing the risks                            and benefits, the patient was deemed in                            satisfactory condition to undergo the procedure.                           After obtaining informed consent, the colonoscope                            was passed under direct vision. Throughout the   procedure, the patient's blood pressure, pulse, and                            oxygen saturations were monitored continuously. The                            EC38-[id number]) scope was introduced through                            the anus and advanced to the the cecum, identified                            by appendiceal orifice and ileocecal valve. The                            colonoscopy was performed without difficulty. The                            patient tolerated the procedure well. The quality                            of the bowel preparation was adequate. The                            ileocecal valve, appendiceal orifice, and rectum                            were photographed. Scope In: 10:54:39 AM Scope Out: [id number] AM Scope Withdrawal Time: 0 hours 7 minutes 21 seconds  Total Procedure Duration: 0 hours 13 minutes 39 seconds  Findings:      The perianal and digital rectal examinations were normal.      Scattered medium-mouthed diverticula were found in the sigmoid colon and       descending colon.      The exam was otherwise without abnormality on direct and retroflexion       views. Impression:               - Diverticulosis in the sigmoid colon and in the                            descending colon.                           -  The examination was otherwise normal on direct                            and retroflexion views.                           - No specimens collected. Moderate Sedation:      Moderate (conscious) sedation was administered by the endoscopy nurse       and supervised by the endoscopist. The following parameters were       monitored: oxygen saturation, heart rate, blood pressure, respiratory       rate, EKG, adequacy of pulmonary ventilation, and response to care.       Total physician intraservice time was 23 minutes. Recommendation:           - Patient has a contact number available for                            emergencies. The signs and  symptoms of potential                            delayed complications were discussed with the                            patient. Return to normal activities tomorrow.                            Written discharge instructions were provided to the                            patient.                           - Advance diet as tolerated.                           - Continue present medications.                           - Repeat colonoscopy in 10 years for screening                            purposes only if overall health permits. Procedure Code(s):        --- Professional ---                           [phone], Colonoscopy, flexible; diagnostic, including                            collection of specimen(s) by brushing or washing,                            when performed (separate procedure)                           99152, Moderate sedation services provided by the  same physician or other qualified health care                            professional performing the diagnostic or                            therapeutic service that the sedation supports,                            requiring the presence of an independent trained                            observer to assist in the monitoring of the                            patient's level of consciousness and physiological                            status; initial 15 minutes of intraservice time,                            patient age 11 years or older                           [phone], Moderate sedation services; each additional                            15 minutes intraservice time Diagnosis Code(s):        --- Professional ---                           Z12.11, Encounter for screening for malignant                            neoplasm of colon                           K57.30, Diverticulosis of large intestine without                            perforation or abscess without bleeding CPT copyright 2016 American  Medical Association. All rights reserved. The codes documented in this report are preliminary and upon coder review may  be revised to meet current compliance requirements. [name], MD Norvel Richards, MD 05/13/2016 11:12:04 AM This report has been signed electronically. Number of Addenda: 0

## 2016-05-15 DIAGNOSIS — Z79899 Other long term (current) drug therapy: Secondary | ICD-10-CM | POA: Diagnosis not present

## 2016-05-15 DIAGNOSIS — L138 Other specified bullous disorders: Secondary | ICD-10-CM | POA: Diagnosis not present

## 2016-05-16 ENCOUNTER — Encounter (HOSPITAL_COMMUNITY): Payer: Self-pay | Admitting: Internal Medicine

## 2016-06-12 DIAGNOSIS — L138 Other specified bullous disorders: Secondary | ICD-10-CM | POA: Diagnosis not present

## 2016-06-12 DIAGNOSIS — Z79899 Other long term (current) drug therapy: Secondary | ICD-10-CM | POA: Diagnosis not present

## 2016-07-10 DIAGNOSIS — Z79899 Other long term (current) drug therapy: Secondary | ICD-10-CM | POA: Diagnosis not present

## 2016-07-10 DIAGNOSIS — L138 Other specified bullous disorders: Secondary | ICD-10-CM | POA: Diagnosis not present

## 2016-07-17 DIAGNOSIS — L138 Other specified bullous disorders: Secondary | ICD-10-CM | POA: Diagnosis not present

## 2016-08-22 DIAGNOSIS — Z23 Encounter for immunization: Secondary | ICD-10-CM | POA: Diagnosis not present

## 2016-10-29 DIAGNOSIS — E039 Hypothyroidism, unspecified: Secondary | ICD-10-CM | POA: Diagnosis not present

## 2016-10-29 DIAGNOSIS — I10 Essential (primary) hypertension: Secondary | ICD-10-CM | POA: Diagnosis not present

## 2016-10-29 DIAGNOSIS — Z0000 Encounter for general adult medical examination without abnormal findings: Secondary | ICD-10-CM | POA: Diagnosis not present

## 2016-10-29 DIAGNOSIS — Z1389 Encounter for screening for other disorder: Secondary | ICD-10-CM | POA: Diagnosis not present

## 2016-10-29 DIAGNOSIS — E669 Obesity, unspecified: Secondary | ICD-10-CM | POA: Diagnosis not present

## 2016-10-29 DIAGNOSIS — E782 Mixed hyperlipidemia: Secondary | ICD-10-CM | POA: Diagnosis not present

## 2016-10-29 DIAGNOSIS — Z6841 Body Mass Index (BMI) 40.0 and over, adult: Secondary | ICD-10-CM | POA: Diagnosis not present

## 2016-11-06 DIAGNOSIS — L138 Other specified bullous disorders: Secondary | ICD-10-CM | POA: Diagnosis not present

## 2016-11-06 DIAGNOSIS — Z79899 Other long term (current) drug therapy: Secondary | ICD-10-CM | POA: Diagnosis not present

## 2016-11-13 DIAGNOSIS — L138 Other specified bullous disorders: Secondary | ICD-10-CM | POA: Diagnosis not present

## 2016-12-16 DIAGNOSIS — E039 Hypothyroidism, unspecified: Secondary | ICD-10-CM | POA: Diagnosis not present

## 2017-03-05 DIAGNOSIS — L138 Other specified bullous disorders: Secondary | ICD-10-CM | POA: Diagnosis not present

## 2017-03-05 DIAGNOSIS — Z79899 Other long term (current) drug therapy: Secondary | ICD-10-CM | POA: Diagnosis not present

## 2017-03-12 DIAGNOSIS — L138 Other specified bullous disorders: Secondary | ICD-10-CM | POA: Diagnosis not present

## 2017-06-11 DIAGNOSIS — L138 Other specified bullous disorders: Secondary | ICD-10-CM | POA: Diagnosis not present

## 2017-06-11 DIAGNOSIS — Z79899 Other long term (current) drug therapy: Secondary | ICD-10-CM | POA: Diagnosis not present

## 2017-06-18 DIAGNOSIS — L138 Other specified bullous disorders: Secondary | ICD-10-CM | POA: Diagnosis not present

## 2017-06-30 DIAGNOSIS — H524 Presbyopia: Secondary | ICD-10-CM | POA: Diagnosis not present

## 2017-06-30 DIAGNOSIS — H5213 Myopia, bilateral: Secondary | ICD-10-CM | POA: Diagnosis not present

## 2017-06-30 DIAGNOSIS — H2513 Age-related nuclear cataract, bilateral: Secondary | ICD-10-CM | POA: Diagnosis not present

## 2017-06-30 DIAGNOSIS — H52223 Regular astigmatism, bilateral: Secondary | ICD-10-CM | POA: Diagnosis not present

## 2017-08-20 DIAGNOSIS — Z23 Encounter for immunization: Secondary | ICD-10-CM | POA: Diagnosis not present

## 2017-10-08 DIAGNOSIS — L138 Other specified bullous disorders: Secondary | ICD-10-CM | POA: Diagnosis not present

## 2017-10-15 DIAGNOSIS — L138 Other specified bullous disorders: Secondary | ICD-10-CM | POA: Diagnosis not present

## 2017-11-04 DIAGNOSIS — R600 Localized edema: Secondary | ICD-10-CM | POA: Diagnosis not present

## 2017-11-04 DIAGNOSIS — S39012A Strain of muscle, fascia and tendon of lower back, initial encounter: Secondary | ICD-10-CM | POA: Diagnosis not present

## 2017-11-04 DIAGNOSIS — J3489 Other specified disorders of nose and nasal sinuses: Secondary | ICD-10-CM | POA: Diagnosis not present

## 2017-11-04 DIAGNOSIS — Z6841 Body Mass Index (BMI) 40.0 and over, adult: Secondary | ICD-10-CM | POA: Diagnosis not present

## 2017-11-04 DIAGNOSIS — E782 Mixed hyperlipidemia: Secondary | ICD-10-CM | POA: Diagnosis not present

## 2017-11-04 DIAGNOSIS — Z0000 Encounter for general adult medical examination without abnormal findings: Secondary | ICD-10-CM | POA: Diagnosis not present

## 2017-11-04 DIAGNOSIS — E039 Hypothyroidism, unspecified: Secondary | ICD-10-CM | POA: Diagnosis not present

## 2017-11-04 DIAGNOSIS — Z1389 Encounter for screening for other disorder: Secondary | ICD-10-CM | POA: Diagnosis not present

## 2017-11-04 DIAGNOSIS — Z0001 Encounter for general adult medical examination with abnormal findings: Secondary | ICD-10-CM | POA: Diagnosis not present

## 2017-11-19 DIAGNOSIS — L138 Other specified bullous disorders: Secondary | ICD-10-CM | POA: Diagnosis not present

## 2018-01-28 DIAGNOSIS — L138 Other specified bullous disorders: Secondary | ICD-10-CM | POA: Diagnosis not present

## 2018-02-04 DIAGNOSIS — L138 Other specified bullous disorders: Secondary | ICD-10-CM | POA: Diagnosis not present

## 2018-06-03 DIAGNOSIS — Z79899 Other long term (current) drug therapy: Secondary | ICD-10-CM | POA: Diagnosis not present

## 2018-06-03 DIAGNOSIS — L138 Other specified bullous disorders: Secondary | ICD-10-CM | POA: Diagnosis not present

## 2018-06-10 DIAGNOSIS — L138 Other specified bullous disorders: Secondary | ICD-10-CM | POA: Diagnosis not present

## 2018-08-13 DIAGNOSIS — Z23 Encounter for immunization: Secondary | ICD-10-CM | POA: Diagnosis not present

## 2018-10-07 DIAGNOSIS — L138 Other specified bullous disorders: Secondary | ICD-10-CM | POA: Diagnosis not present

## 2018-10-07 DIAGNOSIS — Z79899 Other long term (current) drug therapy: Secondary | ICD-10-CM | POA: Diagnosis not present

## 2018-10-11 DIAGNOSIS — J019 Acute sinusitis, unspecified: Secondary | ICD-10-CM | POA: Diagnosis not present

## 2018-10-11 DIAGNOSIS — Z6841 Body Mass Index (BMI) 40.0 and over, adult: Secondary | ICD-10-CM | POA: Diagnosis not present

## 2018-10-11 DIAGNOSIS — Z1389 Encounter for screening for other disorder: Secondary | ICD-10-CM | POA: Diagnosis not present

## 2018-11-23 DIAGNOSIS — Z0000 Encounter for general adult medical examination without abnormal findings: Secondary | ICD-10-CM | POA: Diagnosis not present

## 2018-11-23 DIAGNOSIS — Z1389 Encounter for screening for other disorder: Secondary | ICD-10-CM | POA: Diagnosis not present

## 2018-11-23 DIAGNOSIS — Z6841 Body Mass Index (BMI) 40.0 and over, adult: Secondary | ICD-10-CM | POA: Diagnosis not present

## 2018-11-23 DIAGNOSIS — E7849 Other hyperlipidemia: Secondary | ICD-10-CM | POA: Diagnosis not present

## 2018-11-23 DIAGNOSIS — M170 Bilateral primary osteoarthritis of knee: Secondary | ICD-10-CM | POA: Diagnosis not present

## 2018-11-23 DIAGNOSIS — R7309 Other abnormal glucose: Secondary | ICD-10-CM | POA: Diagnosis not present

## 2018-11-23 DIAGNOSIS — Z0001 Encounter for general adult medical examination with abnormal findings: Secondary | ICD-10-CM | POA: Diagnosis not present

## 2019-01-13 DIAGNOSIS — H00021 Hordeolum internum right upper eyelid: Secondary | ICD-10-CM | POA: Diagnosis not present

## 2019-01-13 DIAGNOSIS — Z6841 Body Mass Index (BMI) 40.0 and over, adult: Secondary | ICD-10-CM | POA: Diagnosis not present

## 2019-04-04 DIAGNOSIS — Z79899 Other long term (current) drug therapy: Secondary | ICD-10-CM | POA: Diagnosis not present

## 2019-04-04 DIAGNOSIS — L138 Other specified bullous disorders: Secondary | ICD-10-CM | POA: Diagnosis not present

## 2019-04-14 DIAGNOSIS — L138 Other specified bullous disorders: Secondary | ICD-10-CM | POA: Diagnosis not present

## 2019-04-14 DIAGNOSIS — L718 Other rosacea: Secondary | ICD-10-CM | POA: Diagnosis not present

## 2019-07-14 DIAGNOSIS — Z23 Encounter for immunization: Secondary | ICD-10-CM | POA: Diagnosis not present

## 2019-10-07 DIAGNOSIS — Z79899 Other long term (current) drug therapy: Secondary | ICD-10-CM | POA: Diagnosis not present

## 2019-10-07 DIAGNOSIS — L138 Other specified bullous disorders: Secondary | ICD-10-CM | POA: Diagnosis not present

## 2019-10-13 DIAGNOSIS — L138 Other specified bullous disorders: Secondary | ICD-10-CM | POA: Diagnosis not present

## 2019-12-04 DIAGNOSIS — E7849 Other hyperlipidemia: Secondary | ICD-10-CM | POA: Diagnosis not present

## 2019-12-04 DIAGNOSIS — I10 Essential (primary) hypertension: Secondary | ICD-10-CM | POA: Diagnosis not present

## 2019-12-04 DIAGNOSIS — E039 Hypothyroidism, unspecified: Secondary | ICD-10-CM | POA: Diagnosis not present

## 2019-12-04 DIAGNOSIS — M1991 Primary osteoarthritis, unspecified site: Secondary | ICD-10-CM | POA: Diagnosis not present

## 2019-12-08 DIAGNOSIS — E7849 Other hyperlipidemia: Secondary | ICD-10-CM | POA: Diagnosis not present

## 2019-12-08 DIAGNOSIS — E039 Hypothyroidism, unspecified: Secondary | ICD-10-CM | POA: Diagnosis not present

## 2019-12-08 DIAGNOSIS — M170 Bilateral primary osteoarthritis of knee: Secondary | ICD-10-CM | POA: Diagnosis not present

## 2019-12-08 DIAGNOSIS — I10 Essential (primary) hypertension: Secondary | ICD-10-CM | POA: Diagnosis not present

## 2019-12-08 DIAGNOSIS — M1991 Primary osteoarthritis, unspecified site: Secondary | ICD-10-CM | POA: Diagnosis not present

## 2019-12-08 DIAGNOSIS — L138 Other specified bullous disorders: Secondary | ICD-10-CM | POA: Diagnosis not present

## 2019-12-08 DIAGNOSIS — Z0000 Encounter for general adult medical examination without abnormal findings: Secondary | ICD-10-CM | POA: Diagnosis not present

## 2019-12-08 DIAGNOSIS — Z1389 Encounter for screening for other disorder: Secondary | ICD-10-CM | POA: Diagnosis not present

## 2019-12-08 DIAGNOSIS — Z6841 Body Mass Index (BMI) 40.0 and over, adult: Secondary | ICD-10-CM | POA: Diagnosis not present

## 2020-01-02 DIAGNOSIS — M170 Bilateral primary osteoarthritis of knee: Secondary | ICD-10-CM | POA: Diagnosis not present

## 2020-01-02 DIAGNOSIS — Z6841 Body Mass Index (BMI) 40.0 and over, adult: Secondary | ICD-10-CM | POA: Diagnosis not present

## 2020-01-16 DIAGNOSIS — E039 Hypothyroidism, unspecified: Secondary | ICD-10-CM | POA: Diagnosis not present

## 2020-02-01 DIAGNOSIS — E039 Hypothyroidism, unspecified: Secondary | ICD-10-CM | POA: Diagnosis not present

## 2020-02-01 DIAGNOSIS — I10 Essential (primary) hypertension: Secondary | ICD-10-CM | POA: Diagnosis not present

## 2020-02-01 DIAGNOSIS — M1991 Primary osteoarthritis, unspecified site: Secondary | ICD-10-CM | POA: Diagnosis not present

## 2020-02-01 DIAGNOSIS — E7849 Other hyperlipidemia: Secondary | ICD-10-CM | POA: Diagnosis not present

## 2020-02-28 DIAGNOSIS — E039 Hypothyroidism, unspecified: Secondary | ICD-10-CM | POA: Diagnosis not present

## 2020-04-02 DIAGNOSIS — E7849 Other hyperlipidemia: Secondary | ICD-10-CM | POA: Diagnosis not present

## 2020-04-02 DIAGNOSIS — I10 Essential (primary) hypertension: Secondary | ICD-10-CM | POA: Diagnosis not present

## 2020-04-02 DIAGNOSIS — M1991 Primary osteoarthritis, unspecified site: Secondary | ICD-10-CM | POA: Diagnosis not present

## 2020-04-02 DIAGNOSIS — E039 Hypothyroidism, unspecified: Secondary | ICD-10-CM | POA: Diagnosis not present

## 2020-04-12 DIAGNOSIS — L138 Other specified bullous disorders: Secondary | ICD-10-CM | POA: Diagnosis not present

## 2020-04-13 DIAGNOSIS — L138 Other specified bullous disorders: Secondary | ICD-10-CM | POA: Diagnosis not present

## 2020-04-13 DIAGNOSIS — Z79899 Other long term (current) drug therapy: Secondary | ICD-10-CM | POA: Diagnosis not present

## 2020-06-01 DIAGNOSIS — M159 Polyosteoarthritis, unspecified: Secondary | ICD-10-CM | POA: Diagnosis not present

## 2020-06-01 DIAGNOSIS — I10 Essential (primary) hypertension: Secondary | ICD-10-CM | POA: Diagnosis not present

## 2020-06-01 DIAGNOSIS — E039 Hypothyroidism, unspecified: Secondary | ICD-10-CM | POA: Diagnosis not present

## 2020-06-01 DIAGNOSIS — E7849 Other hyperlipidemia: Secondary | ICD-10-CM | POA: Diagnosis not present

## 2020-07-05 DIAGNOSIS — L138 Other specified bullous disorders: Secondary | ICD-10-CM | POA: Diagnosis not present

## 2020-08-02 DIAGNOSIS — E7849 Other hyperlipidemia: Secondary | ICD-10-CM | POA: Diagnosis not present

## 2020-08-02 DIAGNOSIS — M1991 Primary osteoarthritis, unspecified site: Secondary | ICD-10-CM | POA: Diagnosis not present

## 2020-08-02 DIAGNOSIS — I10 Essential (primary) hypertension: Secondary | ICD-10-CM | POA: Diagnosis not present

## 2020-08-02 DIAGNOSIS — E039 Hypothyroidism, unspecified: Secondary | ICD-10-CM | POA: Diagnosis not present

## 2020-09-01 DIAGNOSIS — E7849 Other hyperlipidemia: Secondary | ICD-10-CM | POA: Diagnosis not present

## 2020-09-01 DIAGNOSIS — E039 Hypothyroidism, unspecified: Secondary | ICD-10-CM | POA: Diagnosis not present

## 2020-09-01 DIAGNOSIS — I10 Essential (primary) hypertension: Secondary | ICD-10-CM | POA: Diagnosis not present

## 2020-09-01 DIAGNOSIS — M1991 Primary osteoarthritis, unspecified site: Secondary | ICD-10-CM | POA: Diagnosis not present

## 2020-10-02 ENCOUNTER — Other Ambulatory Visit (HOSPITAL_COMMUNITY): Payer: Self-pay | Admitting: Psychiatry

## 2020-10-05 DIAGNOSIS — L138 Other specified bullous disorders: Secondary | ICD-10-CM | POA: Diagnosis not present

## 2020-10-05 DIAGNOSIS — Z79899 Other long term (current) drug therapy: Secondary | ICD-10-CM | POA: Diagnosis not present

## 2020-10-11 DIAGNOSIS — L138 Other specified bullous disorders: Secondary | ICD-10-CM | POA: Diagnosis not present

## 2020-12-01 DIAGNOSIS — I10 Essential (primary) hypertension: Secondary | ICD-10-CM | POA: Diagnosis not present

## 2020-12-01 DIAGNOSIS — E039 Hypothyroidism, unspecified: Secondary | ICD-10-CM | POA: Diagnosis not present

## 2020-12-01 DIAGNOSIS — E7849 Other hyperlipidemia: Secondary | ICD-10-CM | POA: Diagnosis not present

## 2020-12-01 DIAGNOSIS — M1991 Primary osteoarthritis, unspecified site: Secondary | ICD-10-CM | POA: Diagnosis not present

## 2021-01-30 DIAGNOSIS — E7849 Other hyperlipidemia: Secondary | ICD-10-CM | POA: Diagnosis not present

## 2021-01-30 DIAGNOSIS — E039 Hypothyroidism, unspecified: Secondary | ICD-10-CM | POA: Diagnosis not present

## 2021-01-30 DIAGNOSIS — I10 Essential (primary) hypertension: Secondary | ICD-10-CM | POA: Diagnosis not present

## 2021-03-12 DIAGNOSIS — E7849 Other hyperlipidemia: Secondary | ICD-10-CM | POA: Diagnosis not present

## 2021-03-12 DIAGNOSIS — Z6841 Body Mass Index (BMI) 40.0 and over, adult: Secondary | ICD-10-CM | POA: Diagnosis not present

## 2021-03-12 DIAGNOSIS — I872 Venous insufficiency (chronic) (peripheral): Secondary | ICD-10-CM | POA: Diagnosis not present

## 2021-03-12 DIAGNOSIS — L98499 Non-pressure chronic ulcer of skin of other sites with unspecified severity: Secondary | ICD-10-CM | POA: Diagnosis not present

## 2021-03-12 DIAGNOSIS — Z0000 Encounter for general adult medical examination without abnormal findings: Secondary | ICD-10-CM | POA: Diagnosis not present

## 2021-03-12 DIAGNOSIS — Z1389 Encounter for screening for other disorder: Secondary | ICD-10-CM | POA: Diagnosis not present

## 2021-03-12 DIAGNOSIS — R600 Localized edema: Secondary | ICD-10-CM | POA: Diagnosis not present

## 2021-03-12 DIAGNOSIS — Z1331 Encounter for screening for depression: Secondary | ICD-10-CM | POA: Diagnosis not present

## 2021-03-12 DIAGNOSIS — Z01419 Encounter for gynecological examination (general) (routine) without abnormal findings: Secondary | ICD-10-CM | POA: Diagnosis not present

## 2021-03-26 DIAGNOSIS — E876 Hypokalemia: Secondary | ICD-10-CM | POA: Diagnosis not present

## 2021-03-26 DIAGNOSIS — E039 Hypothyroidism, unspecified: Secondary | ICD-10-CM | POA: Diagnosis not present

## 2021-04-02 DIAGNOSIS — I10 Essential (primary) hypertension: Secondary | ICD-10-CM | POA: Diagnosis not present

## 2021-04-02 DIAGNOSIS — E039 Hypothyroidism, unspecified: Secondary | ICD-10-CM | POA: Diagnosis not present

## 2021-04-02 DIAGNOSIS — E7849 Other hyperlipidemia: Secondary | ICD-10-CM | POA: Diagnosis not present

## 2021-04-04 DIAGNOSIS — Z79899 Other long term (current) drug therapy: Secondary | ICD-10-CM | POA: Diagnosis not present

## 2021-04-04 DIAGNOSIS — L138 Other specified bullous disorders: Secondary | ICD-10-CM | POA: Diagnosis not present

## 2021-04-05 DIAGNOSIS — I10 Essential (primary) hypertension: Secondary | ICD-10-CM | POA: Diagnosis not present

## 2021-04-05 DIAGNOSIS — H5211 Myopia, right eye: Secondary | ICD-10-CM | POA: Diagnosis not present

## 2021-04-05 DIAGNOSIS — H25813 Combined forms of age-related cataract, bilateral: Secondary | ICD-10-CM | POA: Diagnosis not present

## 2021-04-05 DIAGNOSIS — H35033 Hypertensive retinopathy, bilateral: Secondary | ICD-10-CM | POA: Diagnosis not present

## 2021-04-05 DIAGNOSIS — H52223 Regular astigmatism, bilateral: Secondary | ICD-10-CM | POA: Diagnosis not present

## 2021-04-05 DIAGNOSIS — H524 Presbyopia: Secondary | ICD-10-CM | POA: Diagnosis not present

## 2021-04-11 DIAGNOSIS — L138 Other specified bullous disorders: Secondary | ICD-10-CM | POA: Diagnosis not present

## 2021-06-14 DIAGNOSIS — H2513 Age-related nuclear cataract, bilateral: Secondary | ICD-10-CM | POA: Diagnosis not present

## 2021-06-14 DIAGNOSIS — H2511 Age-related nuclear cataract, right eye: Secondary | ICD-10-CM | POA: Diagnosis not present

## 2021-06-14 DIAGNOSIS — H25013 Cortical age-related cataract, bilateral: Secondary | ICD-10-CM | POA: Diagnosis not present

## 2021-06-14 DIAGNOSIS — H18413 Arcus senilis, bilateral: Secondary | ICD-10-CM | POA: Diagnosis not present

## 2021-06-14 DIAGNOSIS — H25043 Posterior subcapsular polar age-related cataract, bilateral: Secondary | ICD-10-CM | POA: Diagnosis not present

## 2021-07-22 DIAGNOSIS — L120 Bullous pemphigoid: Secondary | ICD-10-CM | POA: Diagnosis not present

## 2021-07-22 DIAGNOSIS — L138 Other specified bullous disorders: Secondary | ICD-10-CM | POA: Diagnosis not present

## 2021-08-08 DIAGNOSIS — L138 Other specified bullous disorders: Secondary | ICD-10-CM | POA: Diagnosis not present

## 2021-08-26 DIAGNOSIS — H2511 Age-related nuclear cataract, right eye: Secondary | ICD-10-CM | POA: Diagnosis not present

## 2021-08-27 DIAGNOSIS — H2512 Age-related nuclear cataract, left eye: Secondary | ICD-10-CM | POA: Diagnosis not present

## 2021-09-16 DIAGNOSIS — H2512 Age-related nuclear cataract, left eye: Secondary | ICD-10-CM | POA: Diagnosis not present

## 2021-10-02 DIAGNOSIS — E039 Hypothyroidism, unspecified: Secondary | ICD-10-CM | POA: Diagnosis not present

## 2021-10-02 DIAGNOSIS — E782 Mixed hyperlipidemia: Secondary | ICD-10-CM | POA: Diagnosis not present

## 2021-10-02 DIAGNOSIS — I10 Essential (primary) hypertension: Secondary | ICD-10-CM | POA: Diagnosis not present

## 2021-10-03 DIAGNOSIS — Z79899 Other long term (current) drug therapy: Secondary | ICD-10-CM | POA: Diagnosis not present

## 2021-10-03 DIAGNOSIS — L138 Other specified bullous disorders: Secondary | ICD-10-CM | POA: Diagnosis not present

## 2021-10-08 DIAGNOSIS — Z23 Encounter for immunization: Secondary | ICD-10-CM | POA: Diagnosis not present

## 2021-10-08 DIAGNOSIS — I872 Venous insufficiency (chronic) (peripheral): Secondary | ICD-10-CM | POA: Diagnosis not present

## 2021-10-08 DIAGNOSIS — I890 Lymphedema, not elsewhere classified: Secondary | ICD-10-CM | POA: Diagnosis not present

## 2021-10-08 DIAGNOSIS — Z6841 Body Mass Index (BMI) 40.0 and over, adult: Secondary | ICD-10-CM | POA: Diagnosis not present

## 2021-10-10 DIAGNOSIS — L138 Other specified bullous disorders: Secondary | ICD-10-CM | POA: Diagnosis not present

## 2021-10-11 DIAGNOSIS — H02532 Eyelid retraction right lower eyelid: Secondary | ICD-10-CM | POA: Diagnosis not present

## 2021-10-11 DIAGNOSIS — H02834 Dermatochalasis of left upper eyelid: Secondary | ICD-10-CM | POA: Diagnosis not present

## 2021-10-11 DIAGNOSIS — L281 Prurigo nodularis: Secondary | ICD-10-CM | POA: Diagnosis not present

## 2021-10-11 DIAGNOSIS — H02032 Senile entropion of right lower eyelid: Secondary | ICD-10-CM | POA: Diagnosis not present

## 2021-10-11 DIAGNOSIS — H02052 Trichiasis without entropian right lower eyelid: Secondary | ICD-10-CM | POA: Diagnosis not present

## 2021-10-11 DIAGNOSIS — H02042 Spastic entropion of right lower eyelid: Secondary | ICD-10-CM | POA: Diagnosis not present

## 2021-10-11 DIAGNOSIS — H02413 Mechanical ptosis of bilateral eyelids: Secondary | ICD-10-CM | POA: Diagnosis not present

## 2021-10-11 DIAGNOSIS — H02831 Dermatochalasis of right upper eyelid: Secondary | ICD-10-CM | POA: Diagnosis not present

## 2021-10-11 DIAGNOSIS — H02035 Senile entropion of left lower eyelid: Secondary | ICD-10-CM | POA: Diagnosis not present

## 2021-10-15 DIAGNOSIS — I890 Lymphedema, not elsewhere classified: Secondary | ICD-10-CM | POA: Diagnosis not present

## 2021-10-17 DIAGNOSIS — L138 Other specified bullous disorders: Secondary | ICD-10-CM | POA: Diagnosis not present

## 2021-10-24 DIAGNOSIS — L138 Other specified bullous disorders: Secondary | ICD-10-CM | POA: Diagnosis not present

## 2021-10-31 DIAGNOSIS — J069 Acute upper respiratory infection, unspecified: Secondary | ICD-10-CM | POA: Diagnosis not present

## 2021-10-31 DIAGNOSIS — Z6841 Body Mass Index (BMI) 40.0 and over, adult: Secondary | ICD-10-CM | POA: Diagnosis not present

## 2021-12-03 DIAGNOSIS — E7849 Other hyperlipidemia: Secondary | ICD-10-CM | POA: Diagnosis not present

## 2021-12-03 DIAGNOSIS — I10 Essential (primary) hypertension: Secondary | ICD-10-CM | POA: Diagnosis not present

## 2021-12-03 DIAGNOSIS — E039 Hypothyroidism, unspecified: Secondary | ICD-10-CM | POA: Diagnosis not present

## 2021-12-26 DIAGNOSIS — L138 Other specified bullous disorders: Secondary | ICD-10-CM | POA: Diagnosis not present

## 2022-01-08 ENCOUNTER — Telehealth (HOSPITAL_COMMUNITY): Payer: Self-pay | Admitting: Physical Therapy

## 2022-01-08 NOTE — Telephone Encounter (Signed)
S/W Pt stated she fell two days ago and is not able to get out for her appointments. Stated she will call us back when she gets better and able to get out again. ?

## 2022-01-13 ENCOUNTER — Ambulatory Visit (HOSPITAL_COMMUNITY): Payer: PPO | Admitting: Physical Therapy

## 2022-01-15 ENCOUNTER — Encounter (HOSPITAL_COMMUNITY): Payer: PPO | Admitting: Physical Therapy

## 2022-01-17 ENCOUNTER — Encounter (HOSPITAL_COMMUNITY): Payer: PPO | Admitting: Physical Therapy

## 2022-01-20 ENCOUNTER — Encounter (HOSPITAL_COMMUNITY): Payer: PPO | Admitting: Physical Therapy

## 2022-01-22 ENCOUNTER — Encounter (HOSPITAL_COMMUNITY): Payer: PPO

## 2022-01-24 ENCOUNTER — Encounter (HOSPITAL_COMMUNITY): Payer: PPO | Admitting: Physical Therapy

## 2022-01-27 ENCOUNTER — Encounter (HOSPITAL_COMMUNITY): Payer: PPO | Admitting: Physical Therapy

## 2022-01-29 ENCOUNTER — Encounter (HOSPITAL_COMMUNITY): Payer: PPO | Admitting: Physical Therapy

## 2022-01-31 ENCOUNTER — Encounter (HOSPITAL_COMMUNITY): Payer: PPO

## 2022-02-20 DIAGNOSIS — L138 Other specified bullous disorders: Secondary | ICD-10-CM | POA: Diagnosis not present

## 2022-03-05 ENCOUNTER — Ambulatory Visit (HOSPITAL_COMMUNITY): Payer: PPO | Attending: Physician Assistant | Admitting: Physical Therapy

## 2022-03-05 ENCOUNTER — Encounter (HOSPITAL_COMMUNITY): Payer: Self-pay | Admitting: Physical Therapy

## 2022-03-05 DIAGNOSIS — I890 Lymphedema, not elsewhere classified: Secondary | ICD-10-CM | POA: Diagnosis not present

## 2022-03-05 NOTE — Therapy (Signed)
?OUTPATIENT PHYSICAL THERAPY ONCOLOGY EVALUATION ? ?Patient Name: Megan Sutton ?MRN: [id number] ?DOB:May 24, 1946, 76 y.o., female ?Today's Date: 03/05/2022 ? ? PT End of Session - 03/05/22 0919   ? ? Visit Number 1   ? Number of Visits 18   ? Date for PT Re-Evaluation 04/16/22   ? Authorization Type [employer]   ? Authorization - Visit Number 1   ? PT Start Time 4481   ? PT Stop Time 8563   ? PT Time Calculation (min) 80 min   ? Activity Tolerance Patient tolerated treatment well   ? ?  ?  ? ?  ? ? ?Past Medical History:  ?Diagnosis Date  ? Cancer [facility])   ? Uterine cancer  ? Hypertension   ? Hypothyroidism   ? ?Past Surgical History:  ?Procedure Laterality Date  ? ABDOMINAL HYSTERECTOMY    ? CHOLECYSTECTOMY  1990  ? COLONOSCOPY N/A 05/13/2016  ? Procedure: COLONOSCOPY;  Surgeon: Daneil Dolin, MD;  Location[facility];  Service: Endoscopy;  Laterality: N/A;  11:30 Am  ? Left inguinal hernia repair    ? TONSILLECTOMY    ? ?Patient Active Problem List  ? Diagnosis Date Noted  ? Diverticulosis of colon without hemorrhage   ? ? ?PCP: Sharilyn Sites  ? ?REFERRING PROVIDER: Collene Mares ? ?REFERRING DIAG: lymphedema  ? ?THERAPY DIAG:  ?Lymphedema  ? ?ONSET DATE: Chronic.  ? ?SUBJECTIVE                                                                                                                                                                                          ? ?SUBJECTIVE STATEMENT: ?PT states that she has bad knees, she is bone on bone on both knees.  She has had shots about a year ago that helped.  She states that she was dx with uterine cancer and had a total hysterectomy 1999 but her legs were swelling before that.  She had fluid leaking from her right thigh and went to the MD about it in January.  She could not get into treatment until March but she fell and had to cancel.  She has fallen 2x in the past six months.   PT states that she has sores on her buttock that she is seeing a dermatologist.    ?PERTINENT HISTORY:  ?As above  ?PAIN:  ?Are you having pain? No sometimes due to the OA  ?NPRS scale: 0/10 ? ?PRECAUTIONS: Other: cellulitis/falls  ? ?WEIGHT BEARING RESTRICTIONS No ? ?FALLS:  ?Has patient fallen in last 6 months? Yes. Number of falls 2 ? ?LIVING ENVIRONMENT: ?Lives with: lives with their  family and lives alone ?Lives in: House/apartment ?Stairs: No ?Has following equipment at home: Gilford Rile - 4 wheeled ? ?OCCUPATION: retired  ? ?LEISURE: N/A ? ?PRIOR LEVEL OF FUNCTION: Independent with basic ADLs ? ?PATIENT GOALS less swelling to be able to move betterl   ? ? ?OBJECTIVE ? ?COGNITION: ? Overall cognitive status: Within functional limits for tasks assessed  ? ?PALPATION: Induration B  ? ?OBSERVATIONS / OTHER ASSESSMENTS: Skin on B LE is very dry and scaley  ? ? ?LYMPHEDEMA ASSESSMENTS:  ? ? ?INFECTIONS: none ? ?Gait:  Slow due to pt stating that her legs feel heavy.  Ambulated  70 ft in 1:30 minutes with ?Rolling walker.  ? ? ? ? ?LE LANDMARK RIGHT ?03/05/2022  ?At groin   ?30 cm proximal to suprapatella   ?20 cm proximal to suprapatella 87.5  ?10 cm proximal to suprapatella 75.8  ?At midpatella / popliteal crease 69  ?30 cm proximal to floor at lateral plantar foot 56.8  ?20 cm proximal to floor at lateral plantar foot 41.5  ?10 cm proximal to floor at lateral plantar foot 33.4  ?Circumference of ankle/heel 37.3  ?5 cm proximal to 1st MTP joint 26.5  ?Across MTP joint 25.7  ?Around proximal great toe   ?(Blank rows = not tested) ? ?LE LANDMARK LEFT ?03/05/2022  ?At groin   ?30 cm proximal to suprapatella   ?20 cm proximal to suprapatella 77.5  ?10 cm proximal to suprapatella 73.8  ?At [place] / popliteal crease 60.6  ?30 cm proximal to floor at lateral plantar foot 56.3  ?20 cm proximal to floor at lateral plantar foot 43.8  ?10 cm proximal to floor at lateral plantar foot 34.6  ?Circumference of ankle/heel 37.3  ?5 cm proximal to 1st MTP joint 26.3  ?Across MTP joint 25 ?  ?Around proximal great toe    ?(Blank rows = not tested) ? ? ? ?TODAY'S TREATMENT  ? ? ?Exercise:  ankle pumps, LAQ, hip ab/adduction, hip flexion, diaphragm breathing and lymph press.  ? ? ?PATIENT EDUCATION:  ?Education details: Four aspects of treatment skin care, exercise, diaphragm breathing and lymphatic press  ?Person educated: Patient and friend ?Education method: Explanation, Demonstration, Tactile cues, Verbal cues, and Handouts ?Education comprehension: verbalized understanding, returned demonstration, and needs further education ? ? ?HOME EXERCISE PROGRAM: ?ankle pumps, LAQ, hip ab/adduction, hip flexion, diaphragm breathing and lymph press.  ? ? ?ASSESSMENT: ? ?CLINICAL IMPRESSION: ?Patient is a 76 y.o. F who was seen today for physical therapy evaluation and treatment for B LE lymphedema. Evaluation demonstrates induration in B LE noted decreased skin integrity B with history of Rt lymphorrhea, noted hyper keratosis, papillomatosis.  Mr. Vaccarella will benefit from skilled PT for total decongestive techniques to decrease the risk of cellulitis and improve her mobility.  ? ? ?OBJECTIVE IMPAIRMENTS decreased activity tolerance, decreased balance, decreased mobility, difficulty walking, increased edema, pain, and skin integrithy  .  ? ?ACTIVITY LIMITATIONS cleaning, community activity, driving, laundry, and shopping.  ? ?PERSONAL FACTORS Age are also affecting patient's functional outcome.  ? ? ?REHAB POTENTIAL: Good ? ?CLINICAL DECISION MAKING: Evolving/moderate complexity ? ?EVALUATION COMPLEXITY: Moderate ? ?GOALS: ?Goals reviewed with patient? Yes ? ?SHORT TERM GOALS: Target date: 03/26/2022 ? ?Pt to have lost 3 cm in thigh area; 2 in LE to reduce risk of cellulitis  ?Baseline: ?Goal status: INITIAL ? ?LONG TERM GOALS: Target date: 04/16/2022 ? ?PT to have lost 6 cm in thigh area; 3-4 in LE to reduce risk of cellulitis ?  Baseline:  ?Goal status: INITIAL ? ?2.  Pt to be able to walk 70 ft in a minute to demonstrate improved mobility   ?Baseline:  ?Goal status: INITIAL ? ?3.  PT to be completing exercises on a regular basis.  ?Baseline:  ?Goal status: INITIAL ? ?4.  PT to have and be using her compression pump ?Baseline:  ?Goal status: INITIAL ? ?5.  PT to have compression ware for her LE for maintenance phase  ?Baseline:  ?Goal status: INITIAL ? ?PLAN: ?PT FREQUENCY: 3x/week ? ?PT DURATION: 6 weeks ? ?PLANNED INTERVENTIONS: Therapeutic exercises, Patient/Family education, Manual lymph drainage, and Compression bandaging ? ?PLAN FOR NEXT SESSION: cut foam begin decongestive techniques. Begin educating in self massage.  ? ? ?Rayetta Humphrey, PT CLT ?[phone]  ?03/05/2022, 9:20 AM  ?

## 2022-03-07 ENCOUNTER — Ambulatory Visit (HOSPITAL_COMMUNITY): Payer: PPO

## 2022-03-10 ENCOUNTER — Ambulatory Visit (HOSPITAL_COMMUNITY): Payer: PPO | Admitting: Physical Therapy

## 2022-03-10 DIAGNOSIS — I890 Lymphedema, not elsewhere classified: Secondary | ICD-10-CM | POA: Diagnosis not present

## 2022-03-10 NOTE — Therapy (Signed)
?OUTPATIENT PHYSICAL THERAPY TREATMENT NOTE ? ? ?Patient Name: Megan Sutton ?MRN: [id number] ?DOB:[birth year], 76 y.o., female ?Today's Date: 03/10/2022 ? ?PCP: Sharilyn Sites ?REFERRING PROVIDER: Collene Mares ? ?END OF SESSION:  ? PT End of Session - 03/10/22 1519   ? ? Visit Number 2   ? Number of Visits 18   ? Date for PT Re-Evaluation 04/16/22   ? Authorization Type [employer]   ? Authorization - Visit Number 2   ? Progress Note Due on Visit 10   ? PT Start Time 1005   ? PT Stop Time 1128   ? PT Time Calculation (min) 83 min   ? Activity Tolerance Patient tolerated treatment well   ? ?  ?  ? ?  ? ? ?Past Medical History:  ?Diagnosis Date  ? Cancer [facility])   ? Uterine cancer  ? Hypertension   ? Hypothyroidism   ? ?Past Surgical History:  ?Procedure Laterality Date  ? ABDOMINAL HYSTERECTOMY    ? CHOLECYSTECTOMY  1990  ? COLONOSCOPY N/A 05/13/2016  ? Procedure: COLONOSCOPY;  Surgeon: Daneil Dolin, MD;  Location[facility];  Service: Endoscopy;  Laterality: N/A;  11:30 Am  ? Left inguinal hernia repair    ? TONSILLECTOMY    ? ?Patient Active Problem List  ? Diagnosis Date Noted  ? Diverticulosis of colon without hemorrhage   ? ? ?REFERRING DIAG: Lymphedema bilateral LE's ? ?THERAPY DIAG:  ?No diagnosis found. ? ?PERTINENT HISTORY: Bilateral knee arthritis, Uterine Cancer with hysterectomy 1999, falls ? ?PRECAUTIONS: Falls, cellulitis ? ?SUBJECTIVE: Pt and friend state they misunderstood regarding ordering the short stretch bandaging.  States they will do that today.  ? ?PAIN:  ?Are you having pain? Yes: NPRS scale: 0/10 ?Pain location: knees sometimes hurt due to arthritis ?Pain description: aching, soreness ?Aggravating factors: walking, increased activity ?Relieving factors: rest ? ? ?OBJECTIVE: (objective measures completed at initial evaluation unless otherwise dated) ? ?  ?COGNITION: ?           Overall cognitive status: Within functional limits for tasks assessed  ?  ?PALPATION: Induration B  ?   ?OBSERVATIONS / OTHER ASSESSMENTS: Skin on B LE is very dry and scaley  ?  ?  ?LYMPHEDEMA ASSESSMENTS:  ?  ?  ?INFECTIONS: none ?  ?Gait:  Slow due to pt stating that her legs feel heavy.  Ambulated  70 ft in 1:30 minutes with ?Rolling walker.  ?  ?  ?  ?  ?LE LANDMARK RIGHT ?03/05/22  ?At groin    ?30 cm proximal to suprapatella    ?20 cm proximal to suprapatella 87.5  ?10 cm proximal to suprapatella 75.8  ?At midpatella / popliteal crease 69  ?30 cm proximal to floor at lateral plantar foot 56.8  ?20 cm proximal to floor at lateral plantar foot 41.5  ?10 cm proximal to floor at lateral plantar foot 33.4  ?Circumference of ankle/heel 37.3  ?5 cm proximal to 1st MTP joint 26.5  ?Across MTP joint 25.7  ?Around proximal great toe    ?(Blank rows = not tested) ?  ?LE LANDMARK LEFT ?03/05/22  ?At groin    ?30 cm proximal to suprapatella    ?20 cm proximal to suprapatella 77.5  ?10 cm proximal to suprapatella 73.8  ?At [place] / popliteal crease 60.6  ?30 cm proximal to floor at lateral plantar foot 56.3  ?20 cm proximal to floor at lateral plantar foot 43.8  ?10 cm proximal to floor at lateral  plantar foot 34.6  ?Circumference of ankle/heel 37.3  ?5 cm proximal to 1st MTP joint 26.3  ?Across MTP joint 25 ?   ?Around proximal great toe    ?(Blank rows = not tested) ?  ?  ?  ?TODAY'S TREATMENT  ? 03/10/22: ?Manual lymph drainage to anterior aspect only due to time ?Supine:  short neck, deep and superficial abdominal lymph nodes, bil LE inguinal-axillary anastomosis for bilateral LE's ?Compression bandaging: Distal bil LE's with profore and 1/2" foam ?Other:  Foam cut for Rt upper thigh in addition to distal LE's ?  ?  ?03/05/22 evaluation ?Exercise:  ankle pumps, LAQ, hip ab/adduction, hip flexion, diaphragm breathing and lymph press.  ?  ?  ?PATIENT EDUCATION:  ?Education details: 5/8:  Goals for lymphatic therapy, reminder to order short stretch compression, Informed she would be hearing from pump representative ?5/3:Four  aspects of treatment skin care, exercise, diaphragm breathing and lymphatic press  ?Person educated: Patient and friend ?Education method: Explanation, Demonstration, Tactile cues, Verbal cues, and Handouts ?Education comprehension: verbalized understanding, returned demonstration, and needs further education ?  ?  ?HOME EXERCISE PROGRAM: ?ankle pumps, LAQ, hip ab/adduction, hip flexion, diaphragm breathing and lymph press.  ?  ?  ?ASSESSMENT: ?  ?CLINICAL IMPRESSION: ?Pt with questions regarding lymphedema, the pump and course of treatment.  Able to answer all questions today.  Foam cut for distal bil LE's and added to LE's with bandaging using profore this session.  Manual completed in anterior position only due to time.  Pt with some tenderness to distal LE and induration noted.  LE's moisturized well prior to bandaging.  Pt reported overall comfort.  Education provided to find other footwear that will fit better with compression garments in place.   ?  ?OBJECTIVE IMPAIRMENTS decreased activity tolerance, decreased balance, decreased mobility, difficulty walking, increased edema, pain, and skin integrithy  .  ?  ?ACTIVITY LIMITATIONS cleaning, community activity, driving, laundry, and shopping.  ?  ?PERSONAL FACTORS Age are also affecting patient's functional outcome.  ?  ?  ?REHAB POTENTIAL: Good ?  ?CLINICAL DECISION MAKING: Evolving/moderate complexity ?  ?EVALUATION COMPLEXITY: Moderate ?  ?GOALS: ?Goals reviewed with patient? Yes ?  ?SHORT TERM GOALS: Target date: 03/26/2022 ?  ?Pt to have lost 3 cm in thigh area; 2 in LE to reduce risk of cellulitis  ?Baseline: ?Goal status: ONGOING ?  ?LONG TERM GOALS: Target date: 04/16/2022 ?  ?PT to have lost 6 cm in thigh area; 3-4 in LE to reduce risk of cellulitis ?Baseline:  ?Goal status: ONGOING ?  ?2.  Pt to be able to walk 70 ft in a minute to demonstrate improved mobility  ?Baseline:  ?Goal status: ONGOING ?  ?3.  PT to be completing exercises on a regular basis.   ?Baseline:  ?Goal status: ONGOING ?  ?4.  PT to have and be using her compression pump ?Baseline:  ?Goal status: ONGOING ?  ?5.  PT to have compression ware for her LE for maintenance phase  ?Baseline:  ?Goal status: ONGOING ?  ?PLAN: ?PT FREQUENCY: 3x/week ?  ?PT DURATION: 6 weeks ?  ?PLANNED INTERVENTIONS: Therapeutic exercises, Patient/Family education, Manual lymph drainage, and Compression bandaging ?  ?PLAN FOR NEXT SESSION: Continue with decongestive techniques. Begin educating in self massage.  Switch to short stretch when received. ?  ? ?Laila Myhre Sula Soda, PTA/CLT, WTA ?[phone] ? ?Roseanne Reno B, PTA ?03/10/2022, 3:24 PM ? ?   ?

## 2022-03-12 ENCOUNTER — Ambulatory Visit (HOSPITAL_COMMUNITY): Payer: PPO | Admitting: Physical Therapy

## 2022-03-12 DIAGNOSIS — I890 Lymphedema, not elsewhere classified: Secondary | ICD-10-CM | POA: Diagnosis not present

## 2022-03-12 NOTE — Therapy (Signed)
?OUTPATIENT PHYSICAL THERAPY TREATMENT NOTE ? ? ?Patient Name: Megan Sutton ?MRN: [id number] ?DOB:04-Sep-1946, 76 y.o., female ?Today's Date: 03/12/2022 ? ?PCP: Sharilyn Sites ?REFERRING PROVIDER: Collene Mares ? ?END OF SESSION:  ? PT End of Session - 03/12/22 1037   ? ? Visit Number 3   ? Number of Visits 18   ? Date for PT Re-Evaluation 04/16/22   ? Authorization Type [employer]   ? Authorization - Visit Number 3   ? Progress Note Due on Visit 10   ? PT Start Time 1040   ? PT Stop Time 1200   ? PT Time Calculation (min) 80 min   ? Activity Tolerance Patient tolerated treatment well   ? ?  ?  ? ?  ? ? ?Past Medical History:  ?Diagnosis Date  ? Cancer [facility])   ? Uterine cancer  ? Hypertension   ? Hypothyroidism   ? ?Past Surgical History:  ?Procedure Laterality Date  ? ABDOMINAL HYSTERECTOMY    ? CHOLECYSTECTOMY  1990  ? COLONOSCOPY N/A 05/13/2016  ? Procedure: COLONOSCOPY;  Surgeon: Daneil Dolin, MD;  [facility];  Service: Endoscopy;  Laterality: N/A;  11:30 Am  ? Left inguinal hernia repair    ? TONSILLECTOMY    ? ?Patient Active Problem List  ? Diagnosis Date Noted  ? Diverticulosis of colon without hemorrhage   ? ? ?REFERRING DIAG: Lymphedema bilateral LE's ? ?THERAPY DIAG:  ?Lymphedema, not elsewhere classified ? ?PERTINENT HISTORY: Bilateral knee arthritis, Uterine Cancer with hysterectomy 1999, falls ? ?PRECAUTIONS: Falls, cellulitis ? ?SUBJECTIVE: PT states that the compression bandaging caused her significant pain.  PT has ordered foot wear but had difficulty in ordering short stretch ?                        Will do so today.   ?PAIN:  ?Are you having pain? Yes: NPRS scale: 7/10 ?Pain location: knees sometimes hurt due to arthritis ?Pain description: aching, soreness ?Aggravating factors: walking, increased activity ?Relieving factors: rest ? ? ?OBJECTIVE: (objective measures completed at initial evaluation unless otherwise dated) ? ?  ?COGNITION: ?           Overall cognitive status:  Within functional limits for tasks assessed  ?  ?PALPATION: Induration B  ?  ?OBSERVATIONS / OTHER ASSESSMENTS: Skin on B LE is very dry and scaley  ?  ?  ?LYMPHEDEMA ASSESSMENTS:  ?  ?  ?INFECTIONS: none ?  ?Gait:  Slow due to pt stating that her legs feel heavy.  Ambulated  70 ft in 1:30 minutes with ?Rolling walker.  ?  ?  ?  ?  ?LE LANDMARK RIGHT ?03/05/22  ?At groin    ?30 cm proximal to suprapatella    ?20 cm proximal to suprapatella 87.5  ?10 cm proximal to suprapatella 75.8  ?At midpatella / popliteal crease 69  ?30 cm proximal to floor at lateral plantar foot 56.8  ?20 cm proximal to floor at lateral plantar foot 41.5  ?10 cm proximal to floor at lateral plantar foot 33.4  ?Circumference of ankle/heel 37.3  ?5 cm proximal to 1st MTP joint 26.5  ?Across MTP joint 25.7  ?Around proximal great toe    ?(Blank rows = not tested) ?  ?LE LANDMARK LEFT ?03/05/22  ?At groin    ?30 cm proximal to suprapatella    ?20 cm proximal to suprapatella 77.5  ?10 cm proximal to suprapatella 73.8  ?At [facility] /  popliteal crease 60.6  ?30 cm proximal to floor at lateral plantar foot 56.3  ?20 cm proximal to floor at lateral plantar foot 43.8  ?10 cm proximal to floor at lateral plantar foot 34.6  ?Circumference of ankle/heel 37.3  ?5 cm proximal to 1st MTP joint 26.3  ?Across MTP joint 25 ?   ?Around proximal great toe    ?(Blank rows = not tested) ?  ?  ?  ?TODAY'S TREATMENT  ?             03/12/2022: ?            Education on self manual techniques having pt demonstrate following instruction.  ?            Supine:  short neck, deep and superficial abdominal lymph nodes, bil LE inguinal-axillary anastomosis and bilateral LE's ?Compression bandaging: Distal bil LE's with profore and 1/2" foam, therapist cut pressure relieving areas into foam and added ?More cotton to attempt to improve pt tolerance.   ? ? 03/10/22: ?Manual lymph drainage to anterior aspect only due to time ?Supine:  short neck, deep and superficial abdominal lymph  nodes, bil LE inguinal-axillary anastomosis for bilateral LE's ?Compression bandaging: Distal bil LE's with profore and 1/2" foam ?Other:  Foam cut for Rt upper thigh in addition to distal LE's ?  ?  ?03/05/22 evaluation ?Exercise:  ankle pumps, LAQ, hip ab/adduction, hip flexion, diaphragm breathing and lymph press.  ?  ?  ?PATIENT EDUCATION:  ?Education details: 5/8:  Goals for lymphatic therapy, reminder to order short stretch compression, Informed she would be hearing from pump representative ?5/3:Four aspects of treatment skin care, exercise, diaphragm breathing and lymphatic press  ?Person educated: Patient and friend ?Education method: Explanation, Demonstration, Tactile cues, Verbal cues, and Handouts ?Education comprehension: verbalized understanding, returned demonstration, and needs further education ?  ?  ?HOME EXERCISE PROGRAM: ?ankle pumps, LAQ, hip ab/adduction, hip flexion, diaphragm breathing and lymph press.  ?  ?  ?ASSESSMENT: ?  ?CLINICAL IMPRESSION: ?              Pt had significant reduction of edema.  Unfortunately, there is increased edema right above where porfore compression ended causing increased induration and blisters. ?              Therapist gently cleansed and liberally moisturized LE.  Completed alteration on foam to attempt to improve comfort but stressed if bandaging in increasing pain pt is to take        ?              Bandaging sx off.  Therapist began education on self manual techniques.  Pump to arrive in two weeks.  ?OBJECTIVE IMPAIRMENTS decreased activity tolerance, decreased balance, decreased mobility, difficulty walking, increased edema, pain, and skin integrithy  .  ?  ?ACTIVITY LIMITATIONS cleaning, community activity, driving, laundry, and shopping.  ?  ?PERSONAL FACTORS Age are also affecting patient's functional outcome.  ?  ?  ?REHAB POTENTIAL: Good ?  ?CLINICAL DECISION MAKING: Evolving/moderate complexity ?  ?EVALUATION COMPLEXITY: Moderate ?  ?GOALS: ?Goals  reviewed with patient? Yes ?  ?SHORT TERM GOALS: Target date: 03/26/2022 ?  ?Pt to have lost 3 cm in thigh area; 2 in LE to reduce risk of cellulitis  ?Baseline: ?Goal status: partially met ?  ?LONG TERM GOALS: Target date: 04/16/2022 ?  ?PT to have lost 6 cm in thigh area; 3-4 in LE to reduce risk of cellulitis ?Baseline:  ?Goal status:  ONGOING ?  ?2.  Pt to be able to walk 70 ft in a minute to demonstrate improved mobility  ?Baseline:  ?Goal status: ONGOING ?  ?3.  PT to be completing exercises on a regular basis.  ?Baseline:  ?Goal status: ONGOING ?  ?4.  PT to have and be using her compression pump ?Baseline:  ?Goal status: ONGOING ?  ?5.  PT to have compression ware for her LE for maintenance phase  ?Baseline:  ?Goal status: ONGOING ?  ?PLAN: ?PT FREQUENCY: 3x/week ?  ?PT DURATION: 6 weeks ?  ?PLANNED INTERVENTIONS: Therapeutic exercises, Patient/Family education, Manual lymph drainage, and Compression bandaging ?  ?PLAN FOR NEXT SESSION:  Measure.Continue with decongestive techniques. Review  self massage.  Switch to short stretch when received. ?  ? ?Rayetta Humphrey, PT CLT ?[phone]  ?03/12/2022, 10:39 AM ? ?   ?

## 2022-03-14 ENCOUNTER — Encounter (HOSPITAL_COMMUNITY): Payer: Self-pay

## 2022-03-14 ENCOUNTER — Ambulatory Visit (HOSPITAL_COMMUNITY): Payer: PPO

## 2022-03-14 DIAGNOSIS — I890 Lymphedema, not elsewhere classified: Secondary | ICD-10-CM | POA: Diagnosis not present

## 2022-03-14 NOTE — Therapy (Signed)
?OUTPATIENT PHYSICAL THERAPY TREATMENT NOTE ? ? ?Patient Name: Megan Sutton ?MRN: [id number] ?DOB:[birth year], 76 y.o., female ?Today's Date: 03/14/2022 ? ?PCP: Sharilyn Sites ?REFERRING PROVIDER: Collene Mares ? ?END OF SESSION:  ? PT End of Session - 03/14/22 1323   ? ? Visit Number 4   ? Number of Visits 18   ? Date for PT Re-Evaluation 04/16/22   ? Authorization Type [employer]   ? Authorization - Visit Number 4   ? Progress Note Due on Visit 10   ? PT Start Time 1055   ? PT Stop Time 1230   ? PT Time Calculation (min) 95 min   ? Activity Tolerance Patient tolerated treatment well   ? Behavior During Therapy [facility] for tasks assessed/performed   ? ?  ?  ? ?  ? ? ?Past Medical History:  ?Diagnosis Date  ? Cancer [facility])   ? Uterine cancer  ? Hypertension   ? Hypothyroidism   ? ?Past Surgical History:  ?Procedure Laterality Date  ? ABDOMINAL HYSTERECTOMY    ? CHOLECYSTECTOMY  1990  ? COLONOSCOPY N/A 05/13/2016  ? Procedure: COLONOSCOPY;  Surgeon: Daneil Dolin, MD;  [facility];  Service: Endoscopy;  Laterality: N/A;  11:30 Am  ? Left inguinal hernia repair    ? TONSILLECTOMY    ? ?Patient Active Problem List  ? Diagnosis Date Noted  ? Diverticulosis of colon without hemorrhage   ? ? ?REFERRING DIAG: Lymphedema bilateral LE's ? ?THERAPY DIAG:  ?Lymphedema, not elsewhere classified ? ?PERTINENT HISTORY: Bilateral knee arthritis, Uterine Cancer with hysterectomy 1999, falls ? ?PRECAUTIONS: Falls, cellulitis ? ?SUBJECTIVE: Pt stated she has constant soreness on legs, no reports of pain.  Stated some blood from posterior knees.  Stated short stretch to arrive by Wednesday's apt.  ?PAIN:  ?Are you having pain? No pain scale given. ?Pain location: knees sometimes hurt due to arthritis ?Pain description: aching, soreness ?Aggravating factors: walking, increased activity ?Relieving factors: rest ? ? ?OBJECTIVE: (objective measures completed at initial evaluation unless otherwise dated) ? ?  ?COGNITION: ?            Overall cognitive status: Within functional limits for tasks assessed  ?  ?PALPATION: Induration B  ?  ?OBSERVATIONS / OTHER ASSESSMENTS: Skin on B LE is very dry and scaley  ?  ?  ?LYMPHEDEMA ASSESSMENTS:  ?  ?  ?INFECTIONS: none ?  ?Gait:  Slow due to pt stating that her legs feel heavy.  Ambulated  70 ft in 1:30 minutes with ?Rolling walker.  ?  ?  ?  ?  ?LE LANDMARK RIGHT ?03/05/22 Right  ?03/08/22  ?At groin     ?30 cm proximal to suprapatella     ?20 cm proximal to suprapatella 87.5 88.9  ?10 cm proximal to suprapatella 75.8 87  ?At [place] / popliteal crease 69 68.8  ?30 cm proximal to floor at lateral plantar foot 56.8 44.6  ?20 cm proximal to floor at lateral plantar foot 41.5 31.5  ?10 cm proximal to floor at lateral plantar foot 33.4 27.8  ?Circumference of ankle/heel 37.3 33  ?5 cm proximal to 1st MTP joint 26.5 23.9  ?Across MTP joint 25.7 24.7  ?Around proximal great toe   9  ?(Blank rows = not tested) ?  ?LE LANDMARK LEFT ?03/05/22 Left ?03/14/22  ?At groin     ?30 cm proximal to suprapatella     ?20 cm proximal to suprapatella 77.5 77.5  ?10 cm proximal to  suprapatella 73.8 75.3  ?At midpatella / popliteal crease 60.6 59.8  ?30 cm proximal to floor at lateral plantar foot 56.3 45.8  ?20 cm proximal to floor at lateral plantar foot 43.8 23  ?10 cm proximal to floor at lateral plantar foot 34.6 29.6  ?Circumference of ankle/heel 37.3 31.6  ?5 cm proximal to 1st MTP joint 26.3 24.8  ?Across MTP joint 25 ?  23.8  ?Around proximal great toe   9  ?(Blank rows = not tested) ?  ?  ?  ?TODAY'S TREATMENT  ?           03/14/22: ? Reviewed self manual, encouraged to bring fungi cream to address irritated skin posterior knee.   ?            Manual Supine:  short neck, deep and superficial abdominal lymph nodes, bil LE inguinal-axillary anastomosis and bilateral LE's ?Compression bandaging: Distal bil LE's with profore and 1/2" foam, therapist cut pressure relieving areas into foam and added ?More cotton to  attempt to improve pt tolerance. ? ?03/12/2022: ?            Education on self manual techniques having pt demonstrate following instruction.  ?            Supine:  short neck, deep and superficial abdominal lymph nodes, bil LE inguinal-axillary anastomosis and bilateral LE's ?Compression bandaging: Distal bil LE's with profore and 1/2" foam, therapist cut pressure relieving areas into foam and added ?More cotton to attempt to improve pt tolerance.   ? ? 03/10/22: ?Manual lymph drainage to anterior aspect only due to time ?Supine:  short neck, deep and superficial abdominal lymph nodes, bil LE inguinal-axillary anastomosis for bilateral LE's ?Compression bandaging: Distal bil LE's with profore and 1/2" foam ?Other:  Foam cut for Rt upper thigh in addition to distal LE's ?  ?  ?03/05/22 evaluation ?Exercise:  ankle pumps, LAQ, hip ab/adduction, hip flexion, diaphragm breathing and lymph press.  ?  ?  ?PATIENT EDUCATION:  ?Education details: 5/8:  Goals for lymphatic therapy, reminder to order short stretch compression, Informed she would be hearing from pump representative ?5/3:Four aspects of treatment skin care, exercise, diaphragm breathing and lymphatic press  ?Person educated: Patient and friend ?Education method: Explanation, Demonstration, Tactile cues, Verbal cues, and Handouts ?Education comprehension: verbalized understanding, returned demonstration, and needs further education ?  ?  ?HOME EXERCISE PROGRAM: ?ankle pumps, LAQ, hip ab/adduction, hip flexion, diaphragm breathing and lymph press.  ?  ?  ?ASSESSMENT: ?  ?CLINICAL IMPRESSION: ?             Measurements taken with vase improvements of edema distal extremities.  There is increased edema above knee with skin irritation and blisters present.  Therapist cleansed and moistured LEs well. Xeroform placed on skin fold Rt thigh and Lt knee.  Continued with profore with extra cotton and foam.  Short stretch to arrive next week, transition to thigh compression.   Reviewed self manual and bed mobility for increased ease, pt very slow bed mobility and gait mechanics.   ? ?OBJECTIVE IMPAIRMENTS decreased activity tolerance, decreased balance, decreased mobility, difficulty walking, increased edema, pain, and skin integrithy  .  ?  ?ACTIVITY LIMITATIONS cleaning, community activity, driving, laundry, and shopping.  ?  ?PERSONAL FACTORS Age are also affecting patient's functional outcome.  ?  ?  ?REHAB POTENTIAL: Good ?  ?CLINICAL DECISION MAKING: Evolving/moderate complexity ?  ?EVALUATION COMPLEXITY: Moderate ?  ?GOALS: ?Goals reviewed with patient? Yes ?  ?  SHORT TERM GOALS: Target date: 03/26/2022 ?  ?Pt to have lost 3 cm in thigh area; 2 in LE to reduce risk of cellulitis  ?Baseline: ?Goal status: partially met ?  ?LONG TERM GOALS: Target date: 04/16/2022 ?  ?PT to have lost 6 cm in thigh area; 3-4 in LE to reduce risk of cellulitis ?Baseline:  ?Goal status: ONGOING ?  ?2.  Pt to be able to walk 70 ft in a minute to demonstrate improved mobility  ?Baseline:  ?Goal status: ONGOING ?  ?3.  PT to be completing exercises on a regular basis.  ?Baseline:  ?Goal status: ONGOING ?  ?4.  PT to have and be using her compression pump ?Baseline:  ?Goal status: ONGOING ?  ?5.  PT to have compression ware for her LE for maintenance phase  ?Baseline:  ?Goal status: ONGOING ?  ?PLAN: ?PT FREQUENCY: 3x/week ?  ?PT DURATION: 6 weeks ?  ?PLANNED INTERVENTIONS: Therapeutic exercises, Patient/Family education, Manual lymph drainage, and Compression bandaging ?  ?PLAN FOR NEXT SESSION:  Measure.Continue with decongestive techniques. Review  self massage.  Switch to short stretch when received. ?  ?Ihor Austin, LPTA/CLT; CBIS ?[phone] ? ?03/14/2022, 1:25 PM ? ?   ?

## 2022-03-17 ENCOUNTER — Ambulatory Visit (HOSPITAL_COMMUNITY): Payer: PPO | Admitting: Physical Therapy

## 2022-03-17 DIAGNOSIS — I890 Lymphedema, not elsewhere classified: Secondary | ICD-10-CM | POA: Diagnosis not present

## 2022-03-17 NOTE — Therapy (Signed)
?OUTPATIENT PHYSICAL THERAPY TREATMENT NOTE ? ? ?Patient Name: Megan Sutton ?MRN: [id number] ?DOB:[birth year], 76 y.o., female ?Today's Date: 03/17/2022 ? ?PCP: Sharilyn Sites ?REFERRING PROVIDER: Collene Mares ? ?END OF SESSION:  ? PT End of Session - 03/17/22 1221   ? ? Visit Number 5   ? Number of Visits 18   ? Date for PT Re-Evaluation 04/16/22   ? Authorization Type [employer]   ? Authorization - Visit Number 5   ? Progress Note Due on Visit 10   ? PT Start Time 1050   ? PT Stop Time 1220   ? PT Time Calculation (min) 90 min   ? Activity Tolerance Patient tolerated treatment well   ? Behavior During Therapy [facility] for tasks assessed/performed   ? ?  ?  ? ?  ? ? ?Past Medical History:  ?Diagnosis Date  ? Cancer [facility])   ? Uterine cancer  ? Hypertension   ? Hypothyroidism   ? ?Past Surgical History:  ?Procedure Laterality Date  ? ABDOMINAL HYSTERECTOMY    ? CHOLECYSTECTOMY  1990  ? COLONOSCOPY N/A 05/13/2016  ? Procedure: COLONOSCOPY;  Surgeon: Daneil Dolin, MD;  [facility];  Service: Endoscopy;  Laterality: N/A;  11:30 Am  ? Left inguinal hernia repair    ? TONSILLECTOMY    ? ?Patient Active Problem List  ? Diagnosis Date Noted  ? Diverticulosis of colon without hemorrhage   ? ? ?REFERRING DIAG: Lymphedema bilateral LE's ? ?THERAPY DIAG:  ?Lymphedema, not elsewhere classified ? ?PERTINENT HISTORY: Bilateral knee arthritis, Uterine Cancer with hysterectomy 1999, falls ? ?PRECAUTIONS: Falls, cellulitis ? ?SUBJECTIVE: PT states that she felt as if the bandages were blistering her, she took them off around 1:00 AM today and there were blisters on her Lt foot. Pt brought  ?Fungal cream as well as short stretch bandages in today. ?Are you having pain? Yes 4/10 LE B  pain scale given. ?Pain location: knees sometimes hurt due to arthritis ?Pain description: aching, soreness ?Aggravating factors: walking, increased activity ?Relieving factors: rest ? ? ?OBJECTIVE: (objective measures completed at initial  evaluation unless otherwise dated) ? ? ?  ?  ?LYMPHEDEMA ASSESSMENTS:  ?  ?  ? ?  ?  ?  ?  ?  ?  ?LE LANDMARK RIGHT ?03/05/22 Right  ?03/14/22  ?At groin     ?30 cm proximal to suprapatella     ?20 cm proximal to suprapatella 87.5 88.9  ?10 cm proximal to suprapatella 75.8 87  ?At [place] / popliteal crease 69 68.8  ?30 cm proximal to floor at lateral plantar foot 56.8 44.6  ?20 cm proximal to floor at lateral plantar foot 41.5 31.5  ?10 cm proximal to floor at lateral plantar foot 33.4 27.8  ?Circumference of ankle/heel 37.3 33  ?5 cm proximal to 1st MTP joint 26.5 23.9  ?Across MTP joint 25.7 24.7  ?Around proximal great toe   9  ?(Blank rows = not tested) ?  ?LE LANDMARK LEFT ?03/05/22 Left ?03/14/22  ?At groin     ?30 cm proximal to suprapatella     ?20 cm proximal to suprapatella 77.5 77.5  ?10 cm proximal to suprapatella 73.8 75.3  ?At midpatella / popliteal crease 60.6 59.8  ?30 cm proximal to floor at lateral plantar foot 56.3 45.8  ?20 cm proximal to floor at lateral plantar foot 43.8 23  ?10 cm proximal to floor at lateral plantar foot 34.6 29.6  ?Circumference of ankle/heel 37.3 31.6  ?  5 cm proximal to 1st MTP joint 26.3 24.8  ?Across MTP joint 25 ?  23.8  ?Around proximal great toe   9  ?(Blank rows = not tested) ?  ?  ?  ?TODAY'S TREATMENT  ?            03/17/22: ?            Therapist cut foam for thighs.  Liberally applied lotion to LE and Lotramin to posterior aspect of knees.  Treatment was done with pt ?            In sitting position.   ?            Manual Supine:  short neck, deep and superficial abdominal lymph nodes, bil LE inguinal-axillary anastomosis and bilateral LE's ?Compression bandaging: Distal bil LE's 1/2" foam, and multilayer short stretch bandages.  ? ?           03/14/22: ? Reviewed self manual, encouraged to bring fungi cream to address irritated skin posterior knee.   ?            Manual Supine:  short neck, deep and superficial abdominal lymph nodes, bil LE inguinal-axillary  anastomosis and bilateral LE's ?Compression bandaging: Distal bil LE's with profore and 1/2" foam, therapist cut pressure relieving areas into foam and added ?More cotton to attempt to improve pt tolerance. ? ?03/12/2022: ?            Education on self manual techniques having pt demonstrate following instruction.  ?            Supine:  short neck, deep and superficial abdominal lymph nodes, bil LE inguinal-axillary anastomosis and bilateral LE's ?Compression bandaging: Distal bil LE's with profore and 1/2" foam, therapist cut pressure relieving areas into foam and added ?More cotton to attempt to improve pt tolerance.   ? ? 03/10/22: ?Manual lymph drainage to anterior aspect only due to time ?Supine:  short neck, deep and superficial abdominal lymph nodes, bil LE inguinal-axillary anastomosis for bilateral LE's ?Compression bandaging: Distal bil LE's with profore and 1/2" foam ?Other:  Foam cut for Rt upper thigh in addition to distal LE's ?  ?  ?03/05/22 evaluation ?Exercise:  ankle pumps, LAQ, hip ab/adduction, hip flexion, diaphragm breathing and lymph press.  ?  ?  ?PATIENT EDUCATION:  ?Education details: 5/8:  Goals for lymphatic therapy, reminder to order short stretch compression, Informed she would be hearing from pump representative ?5/3:Four aspects of treatment skin care, exercise, diaphragm breathing and lymphatic press  ?Person educated: Patient and friend ?Education method: Explanation, Demonstration, Tactile cues, Verbal cues, and Handouts ?Education comprehension: verbalized understanding, returned demonstration, and needs further education ?  ?  ?HOME EXERCISE PROGRAM: ?ankle pumps, LAQ, hip ab/adduction, hip flexion, diaphragm breathing and lymph press.  ?  ?  ?ASSESSMENT: ?  ?CLINICAL IMPRESSION: ?             Pt continues to be very slow with gait.  Therapist encouraged pt to walk frequently as the short stretch bandages perform better when a patient is active. Therapist  ?                        Added extra layer of foam below bolus on Rt LE to promote even compression.  ? ?OBJECTIVE IMPAIRMENTS decreased activity tolerance, decreased balance, decreased mobility, difficulty walking, increased edema, pain, and skin integrithy  .  ?  ?ACTIVITY LIMITATIONS cleaning, community activity, driving,  laundry, and shopping.  ?  ?PERSONAL FACTORS Age are also affecting patient's functional outcome.  ?  ?  ?REHAB POTENTIAL: Good ?  ?CLINICAL DECISION MAKING: Evolving/moderate complexity ?  ?EVALUATION COMPLEXITY: Moderate ?  ?GOALS: ?Goals reviewed with patient? Yes ?  ?SHORT TERM GOALS: Target date: 03/26/2022 ?  ?Pt to have lost 3 cm in thigh area; 2 in LE to reduce risk of cellulitis  ?Baseline: ?Goal status: partially met ?  ?LONG TERM GOALS: Target date: 04/16/2022 ?  ?PT to have lost 6 cm in thigh area; 3-4 in LE to reduce risk of cellulitis ?Baseline:  ?Goal status: ONGOING ?  ?2.  Pt to be able to walk 70 ft in a minute to demonstrate improved mobility  ?Baseline:  ?Goal status: ONGOING ?  ?3.  PT to be completing exercises on a regular basis.  ?Baseline:  ?Goal status: ONGOING ?  ?4.  PT to have and be using her compression pump ?Baseline:  ?Goal status: ONGOING ?  ?5.  PT to have compression ware for her LE for maintenance phase  ?Baseline:  ?Goal status: ONGOING ?  ?PLAN: ?PT FREQUENCY: 3x/week ?  ?PT DURATION: 6 weeks ?  ?PLANNED INTERVENTIONS: Therapeutic exercises, Patient/Family education, Manual lymph drainage, and Compression bandaging ?  ?PLAN FOR NEXT SESSION:  Assess comfort of short stretch bandages.  Continue with decongestive techniques. ?  ?Rayetta Humphrey, PT CLT ?[phone]  ?[phone] ? ?03/17/2022, 12:23 PM ? ?   ?

## 2022-03-19 ENCOUNTER — Encounter (HOSPITAL_COMMUNITY): Payer: PPO | Admitting: Physical Therapy

## 2022-03-19 ENCOUNTER — Ambulatory Visit (HOSPITAL_COMMUNITY): Payer: PPO | Admitting: Physical Therapy

## 2022-03-19 DIAGNOSIS — I890 Lymphedema, not elsewhere classified: Secondary | ICD-10-CM | POA: Diagnosis not present

## 2022-03-19 NOTE — Therapy (Signed)
?OUTPATIENT PHYSICAL THERAPY TREATMENT NOTE ? ? ?Patient Name: Megan Sutton ?MRN: [id number] ?DOB:03/22/1946, 76 y.o., female ?Today's Date: 03/19/2022 ? ?PCP: Sharilyn Sites ?REFERRING PROVIDER: Collene Mares ? ?END OF SESSION:  ? PT End of Session - 03/19/22 0955   ? ? Visit Number 6   ? Number of Visits 18   ? Date for PT Re-Evaluation 04/16/22   ? Authorization Type [employer]   ? Authorization - Visit Number 6   ? Progress Note Due on Visit 10   ? PT Start Time 1000   ? PT Stop Time 1145  ? PT Time Calculation (min) 105 min   ? ?  ?  ? ?  ? ? ?Past Medical History:  ?Diagnosis Date  ? Cancer [facility])   ? Uterine cancer  ? Hypertension   ? Hypothyroidism   ? ?Past Surgical History:  ?Procedure Laterality Date  ? ABDOMINAL HYSTERECTOMY    ? CHOLECYSTECTOMY  1990  ? COLONOSCOPY N/A 05/13/2016  ? Procedure: COLONOSCOPY;  Surgeon: Daneil Dolin, MD;  [facility];  Service: Endoscopy;  Laterality: N/A;  11:30 Am  ? Left inguinal hernia repair    ? TONSILLECTOMY    ? ?Patient Active Problem List  ? Diagnosis Date Noted  ? Diverticulosis of colon without hemorrhage   ? ? ?REFERRING DIAG: Lymphedema bilateral LE's ? ?THERAPY DIAG:  ?Lymphedema, not elsewhere classified ? ?PERTINENT HISTORY: Bilateral knee arthritis, Uterine Cancer with hysterectomy 1999, falls ? ?PRECAUTIONS: Falls, cellulitis ? ?SUBJECTIVE: Pt lives by herself.  A church friend brings her to therapy, however, she has children of her own that ?She has to get ready for school so she is unable to assist in taking the short stretch bandages off and assist in rolling them up ?Therefore pt comes with short stretch on, however, thigh bandaging has fallen down around ankles.  PT is able to come from sit to stand I and was able to  ?Get her leg into the car on her own today for the first time.  Pt states that the pressure ulcers on her buttock and posterior thighs  ?Are bothering her.  States that the top of her feet feel numb.  ?Are you having  pain? Yes 4/10 LE B  pain scale given. ?Pain location: hip and wounds  ?Pain description: aching, soreness ?Aggravating factors: walking, increased activity ?Relieving factors: rest ? ? ?OBJECTIVE: (objective measures completed at initial evaluation unless otherwise dated) ? ? ?  ?  ?LYMPHEDEMA ASSESSMENTS:  ?  ?  ? ?  ?  ?  ?  ?  ?  ?LE LANDMARK RIGHT ?03/05/22 Right  ?03/14/22  ?At groin     ?30 cm proximal to suprapatella     ?20 cm proximal to suprapatella 87.5 88.9  ?10 cm proximal to suprapatella 75.8 87  ?At [place] / popliteal crease 69 68.8  ?30 cm proximal to floor at lateral plantar foot 56.8 44.6  ?20 cm proximal to floor at lateral plantar foot 41.5 31.5  ?10 cm proximal to floor at lateral plantar foot 33.4 27.8  ?Circumference of ankle/heel 37.3 33  ?5 cm proximal to 1st MTP joint 26.5 23.9  ?Across MTP joint 25.7 24.7  ?Around proximal great toe   9  ?(Blank rows = not tested) ?  ?LE LANDMARK LEFT ?03/05/22 Left ?03/14/22  ?At groin     ?30 cm proximal to suprapatella     ?20 cm proximal to suprapatella 77.5 77.5  ?10 cm  proximal to suprapatella 73.8 75.3  ?At midpatella / popliteal crease 60.6 59.8  ?30 cm proximal to floor at lateral plantar foot 56.3 45.8  ?20 cm proximal to floor at lateral plantar foot 43.8 23  ?10 cm proximal to floor at lateral plantar foot 34.6 29.6  ?Circumference of ankle/heel 37.3 31.6  ?5 cm proximal to 1st MTP joint 26.3 24.8  ?Across MTP joint 25 ?  23.8  ?Around proximal great toe   9  ?(Blank rows = not tested) ?  ?  ?  ?TODAY'S TREATMENT  ?5/11/09/21: Therapist took removed the short stretch, which was given to support staff to roll.  Therapist cleansed and moisturized LE liberally.  Fungal cream  ?                Placed on posterior aspect of knee.  Therapist able to complete manual decongestive techniques to include:short neck, deep and superficial abdominal lymph nodes, bil LE inguinal-axillary anastomosis and bilateral LE's with pt semi supine, head raised.  Able to  complete posterior aspect side lying as pt has improved mobility.  This is the first time therapist has observed pressure ulcers.  Multiple ulcers B upper thighs with deep purple indicative of deep tissue damage.  Pt friend states these have been there form months and months, maybe years.  Therapist put xeroform, ab pad and medipore tape to cover wounds on posterior aspect of thigh, Compression bandaging comes just below this level.   ?              Compression bandaging: Distal bil LE's 1/2" foam, and multilayer short stretch bandages. Therapist used hybird technique with 6 cm to complete both roman sandal and HAS to see if decreased pressure will assist in decreasing complaint of numbness of feet.  Therapist able to bandage thigh with pt standing today.  Next treatment therapist should be able to stand for thigh measurement.   ?            ? 03/17/22: ?            Therapist cut foam for thighs.  Liberally applied lotion to LE and Lotramin to posterior aspect of knees.  Treatment was done with pt ?            In sitting position.   ?            Manual Supine:  short neck, deep and superficial abdominal lymph nodes, bil LE inguinal-axillary anastomosis and bilateral LE's ?Compression bandaging: Distal bil LE's 1/2" foam, and multilayer short stretch bandages.  ? ?           03/14/22: ? Reviewed self manual, encouraged to bring fungi cream to address irritated skin posterior knee.   ?            Manual Supine:  short neck, deep and superficial abdominal lymph nodes, bil LE inguinal-axillary anastomosis and bilateral LE's ?Compression bandaging: Distal bil LE's with profore and 1/2" foam, therapist cut pressure relieving areas into foam and added ?More cotton to attempt to improve pt tolerance. ? ?03/12/2022: ?            Education on self manual techniques having pt demonstrate following instruction.  ?            Supine:  short neck, deep and superficial abdominal lymph nodes, bil LE inguinal-axillary anastomosis and  bilateral LE's ?Compression bandaging: Distal bil LE's with profore and 1/2" foam, therapist cut pressure relieving areas into foam  and added ?More cotton to attempt to improve pt tolerance.   ? ? 03/10/22: ?Manual lymph drainage to anterior aspect only due to time ?Supine:  short neck, deep and superficial abdominal lymph nodes, bil LE inguinal-axillary anastomosis for bilateral LE's ?Compression bandaging: Distal bil LE's with profore and 1/2" foam ?Other:  Foam cut for Rt upper thigh in addition to distal LE's ?  ?  ?03/05/22 evaluation ?Exercise:  ankle pumps, LAQ, hip ab/adduction, hip flexion, diaphragm breathing and lymph press.  ?  ?  ?PATIENT EDUCATION:  ?Education details: 5/8:  Goals for lymphatic therapy, reminder to order short stretch compression, Informed she would be hearing from pump representative ?5/3:Four aspects of treatment skin care, exercise, diaphragm breathing and lymphatic press  ?Person educated: Patient and friend ?Education method: Explanation, Demonstration, Tactile cues, Verbal cues, and Handouts ?Education comprehension: verbalized understanding, returned demonstration, and needs further education ?  ?  ?HOME EXERCISE PROGRAM: ?ankle pumps, LAQ, hip ab/adduction, hip flexion, diaphragm breathing and lymph press.  ?  ?  ?ASSESSMENT: ?  ?CLINICAL IMPRESSION: ?             Noted improved velocity of gait.  Pt able to come sit to stand I.  Significant skin and tissue injury from chronic pressure ulcers.  Therapist continued to stress the importance of getting up every hour and walking.  Bolus area in upper medial RT thigh has decreased and has significant decreased induration.  ?OBJECTIVE IMPAIRMENTS decreased activity tolerance, decreased balance, decreased mobility, difficulty walking, increased edema, pain, and skin integrithy  .  ?  ?ACTIVITY LIMITATIONS cleaning, community activity, driving, laundry, and shopping.  ?  ?PERSONAL FACTORS Age are also affecting patient's functional  outcome.  ?  ?  ?REHAB POTENTIAL: Good ?  ?CLINICAL DECISION MAKING: Evolving/moderate complexity ?  ?EVALUATION COMPLEXITY: Moderate ?  ?GOALS: ?Goals reviewed with patient? Yes ?  ?SHORT TERM GOALS: [employer]

## 2022-03-20 ENCOUNTER — Ambulatory Visit (HOSPITAL_COMMUNITY): Payer: PPO | Admitting: Physical Therapy

## 2022-03-20 DIAGNOSIS — I890 Lymphedema, not elsewhere classified: Secondary | ICD-10-CM

## 2022-03-20 NOTE — Therapy (Signed)
OUTPATIENT PHYSICAL THERAPY TREATMENT NOTE   Patient Name: Megan Sutton MRN: [id number] DOB:1946/03/04, 76 y.o., female Today's Date: 03/20/2022  PCP: Sharilyn Sites REFERRING PROVIDER: Collene Mares  END OF SESSION:   PT End of Session - 03/19/22 0955     Visit Number 6    Number of Visits 18    Date for PT Re-Evaluation 04/16/22    Authorization Type [employer]    Authorization - Visit Number 6    Progress Note Due on Visit 10    PT Start Time 1000    PT Stop Time 1145   PT Time Calculation (min) 105 min             Past Medical History:  Diagnosis Date   Cancer ([facility])    Uterine cancer   Hypertension    Hypothyroidism    Past Surgical History:  Procedure Laterality Date   ABDOMINAL HYSTERECTOMY     CHOLECYSTECTOMY  1990   COLONOSCOPY N/A 05/13/2016   Procedure: COLONOSCOPY;  Surgeon: Daneil Dolin, MD;  Location[facility];  Service: Endoscopy;  Laterality: N/A;  11:30 Am   Left inguinal hernia repair     TONSILLECTOMY     Patient Active Problem List   Diagnosis Date Noted   Diverticulosis of colon without hemorrhage     REFERRING DIAG: Lymphedema bilateral LE's  THERAPY DIAG:  Lymphedema, not elsewhere classified  PERTINENT HISTORY: Bilateral knee arthritis, Uterine Cancer with hysterectomy 1999, falls  PRECAUTIONS: Falls, cellulitis  SUBJECTIVE: Pt states her feet felt better with less bandaging on them.  Pt comes today with neighbor who offered to roll bandaging when therapist removed them.    Are you having pain? Yes 4/10 LE B  pain scale given. Pain location: hip and wounds  Pain description: aching, soreness Aggravating factors: walking, increased activity Relieving factors: rest   OBJECTIVE: (objective measures completed at initial evaluation unless otherwise dated)       LYMPHEDEMA ASSESSMENTS:       LE LANDMARK RIGHT 03/05/22 Right  03/14/22 Right 03/20/22  At groin      30 cm proximal to suprapatella      20 cm  proximal to suprapatella 87.5 88.9 80  10 cm proximal to suprapatella 75.8 87 69  At midpatella / popliteal crease 69 68.8 61  30 cm proximal to floor at lateral plantar foot 56.8 44.6 47  20 cm proximal to floor at lateral plantar foot 41.5 31.5 33.4  10 cm proximal to floor at lateral plantar foot 33.4 27.8 27.2  Circumference of ankle/heel 37.3 33 33  5 cm proximal to 1st MTP joint 26.5 23.9 23.5  Across MTP joint 25.7 24.7 23.3  Around proximal great toe   9 8.3  (Blank rows = not tested)   LE LANDMARK LEFT 03/05/22 Left 03/14/22 Left 03/20/22  At groin      30 cm proximal to suprapatella      20 cm proximal to suprapatella 77.5 77.5 69  10 cm proximal to suprapatella 73.8 75.3 65  At midpatella / popliteal crease 60.6 59.8 55  30 cm proximal to floor at lateral plantar foot 56.3 45.8 43.2  20 cm proximal to floor at lateral plantar foot 43.8 23 33.2  10 cm proximal to floor at lateral plantar foot 34.6 29.6 28  Circumference of ankle/heel 37.3 31.6 31.5  5 cm proximal to 1st MTP joint 26.3 24.8 23.3  Across MTP joint 25   23.8 23.4  Around  proximal great toe   9 8.5  (Blank rows = not tested)       TODAY'S TREATMENT  03/20/22: Short stretch removal Volumetric measurements of LE's (see above) Inspected wounds, covered upper thighs with ABD, sacral wound photographed Bed mobility mod assist supine to/from sit, Rt rolling Compression bandaging full LE's to thigh using 1/2" foam and multilayer short stretch; hybrid technique combination roman sandal and HAS.  Thigh bandaged and measured in standing today with rest breaks needed  5/11/09/21: Therapist took removed the short stretch, which was given to support staff to roll.  Therapist cleansed and moisturized LE liberally.  Fungal cream                  Placed on posterior aspect of knee.  Therapist able to complete manual decongestive techniques to include:short neck, deep and superficial abdominal lymph nodes, bil LE  inguinal-axillary anastomosis and bilateral LE's with pt semi supine, head raised.  Able to complete posterior aspect side lying as pt has improved mobility.  This is the first time therapist has observed pressure ulcers.  Multiple ulcers B upper thighs with deep purple indicative of deep tissue damage.  Pt friend states these have been there form months and months, maybe years.  Therapist put xeroform, ab pad and medipore tape to cover wounds on posterior aspect of thigh, Compression bandaging comes just below this level.                 Compression bandaging: Distal bil LE's 1/2" foam, and multilayer short stretch bandages. Therapist used hybird technique with 6 cm to complete both roman sandal and HAS to see if decreased pressure will assist in decreasing complaint of numbness of feet.  Therapist able to bandage thigh with pt standing today.  Next treatment therapist should be able to stand for thigh measurement.                03/17/22:             Therapist cut foam for thighs.  Liberally applied lotion to LE and Lotramin to posterior aspect of knees.  Treatment was done with pt             In sitting position.               Manual Supine:  short neck, deep and superficial abdominal lymph nodes, bil LE inguinal-axillary anastomosis and bilateral LE's Compression bandaging: Distal bil LE's 1/2" foam, and multilayer short stretch bandages.              03/14/22:  Reviewed self manual, encouraged to bring fungi cream to address irritated skin posterior knee.               Manual Supine:  short neck, deep and superficial abdominal lymph nodes, bil LE inguinal-axillary anastomosis and bilateral LE's Compression bandaging: Distal bil LE's with profore and 1/2" foam, therapist cut pressure relieving areas into foam and added More cotton to attempt to improve pt tolerance.  03/12/2022:             Education on self manual techniques having pt demonstrate following instruction.              Supine:  short  neck, deep and superficial abdominal lymph nodes, bil LE inguinal-axillary anastomosis and bilateral LE's Compression bandaging: Distal bil LE's with profore and 1/2" foam, therapist cut pressure relieving areas into foam and added More cotton to attempt to improve pt tolerance.  03/10/22: Manual lymph drainage to anterior aspect only due to time Supine:  short neck, deep and superficial abdominal lymph nodes, bil LE inguinal-axillary anastomosis for bilateral LE's Compression bandaging: Distal bil LE's with profore and 1/2" foam Other:  Foam cut for Rt upper thigh in addition to distal LE's     03/05/22 evaluation Exercise:  ankle pumps, LAQ, hip ab/adduction, hip flexion, diaphragm breathing and lymph press.      PATIENT EDUCATION:  Education details: 5/18:  See clinical impression:  need for more indepth care for pressure wounds and aggressive therapy for improving mobility 5/8:  Goals for lymphatic therapy, reminder to order short stretch compression, Informed she would be hearing from pump representative 5/3:Four aspects of treatment skin care, exercise, diaphragm breathing and lymphatic press  Person educated: Patient and friend Education method: Explanation, Demonstration, Tactile cues, Verbal cues, and Handouts Education comprehension: verbalized understanding, returned demonstration, and needs further education     HOME EXERCISE PROGRAM: ankle pumps, LAQ, hip ab/adduction, hip flexion, diaphragm breathing and lymph press.      ASSESSMENT:   CLINICAL IMPRESSION: Pt returns today with bandages still intact.  Removal of bandages took 30 minutes of treatment time; discussed with patient ability to remove bandaging and states she thinks she will be able to to do this.  Instructed to remove prior to next appt and roll all bandages back when returns.  Pt verbalized understanding.  LE's measured this session with noted reduction in volume as compared to last week (see above table).  Pt  takes increased time to complete all mobility and tasks, however is improving with ability to mobilize in bed. Pt is unable to lay fully on side due to discomfort and only able to tolerate supine position limited amount of time due to back discomfort.  Pt with noted drainage on shorts when rolled to side and upon inspection has multiple large bed sores on upper thighs and buttocks that are actively draining and purple tissue surrounding as well.  Therapist last session secured bandages onto upper posterior thigh sores, however increased discomfort upon removal as tape was harsh on skin.  Neighbor reports her Dr wanted her to go to an inpatient/skilled facility to work on all her deficits but pt declined.  Spoke to patient regarding this and feel her wounds will not heal unless she is able to return to her bed and get the pressure off of them.  Pt is currently in her recliner 98% of the time and despite the best of wound dressings and care, these will not heal unless the pressure is removed.   Pt also would benefit from progressive intensity of therex/mobilization in conjunction with wound care. Unsure if therapist sent order to MD for juxtafit so order sent this session so she will have these if she does decide to go to a facility.  This session, moisturized LE's well and placed antifungal cream behind knees.  ABD pads placed over superior thigh wounds without tape and placed compression bandaging/foam over this.  No action taken to sacral wound at this time as neighbor reports MD only ordered a cream for this.  May try a sacral pad next time as it is draining through clothes.  Sacral wound was photographed and is located in media section of chart.           OBJECTIVE IMPAIRMENTS decreased activity tolerance, decreased balance, decreased mobility, difficulty walking, increased edema, pain, and skin integrithy  .    ACTIVITY LIMITATIONS cleaning, community activity,  driving, laundry, and shopping.    PERSONAL  FACTORS Age are also affecting patient's functional outcome.      REHAB POTENTIAL: Good   CLINICAL DECISION MAKING: Evolving/moderate complexity   EVALUATION COMPLEXITY: Moderate   GOALS: Goals reviewed with patient? Yes   SHORT TERM GOALS: [place] date: 03/26/2022   Pt to have lost 3 cm in thigh area; 2 in LE to reduce risk of cellulitis  Baseline: Goal status: MET  LONG TERM GOALS: [place] date: 04/16/2022   PT to have lost 6 cm in thigh area; 3-4 in LE to reduce risk of cellulitis Baseline:  Goal status: ONGOING   2.  Pt to be able to walk 70 ft in a minute to demonstrate improved mobility  Baseline:  Goal status: ONGOING   3.  PT to be completing exercises on a regular basis.  Baseline:  Goal status: ONGOING   4.  PT to have and be using her compression pump Baseline:  Goal status: ONGOING   5.  PT to have compression ware for her LE for maintenance phase  Baseline:  Goal status: ONGOING   PLAN: PT FREQUENCY: 3x/week   PT DURATION: 6 weeks   PLANNED INTERVENTIONS: Therapeutic exercises, Patient/Family education, Manual lymph drainage, and Compression bandaging   PLAN FOR NEXT SESSION:  Continue with manual and compression bandaging; Returns to MD next Thursday and will see if pt decides to get more intense care in inpatient setting.   Check on order for juxtafit to be returned and give instructions on how to get this from [facility].  Teena Irani, PTA/CLT [place] Ph: [phone]  03/20/2022, 11:30AM

## 2022-03-24 ENCOUNTER — Encounter (HOSPITAL_COMMUNITY): Payer: Self-pay | Admitting: Physical Therapy

## 2022-03-24 ENCOUNTER — Ambulatory Visit (HOSPITAL_COMMUNITY): Payer: PPO | Admitting: Physical Therapy

## 2022-03-24 DIAGNOSIS — I890 Lymphedema, not elsewhere classified: Secondary | ICD-10-CM | POA: Diagnosis not present

## 2022-03-24 DIAGNOSIS — L138 Other specified bullous disorders: Secondary | ICD-10-CM | POA: Diagnosis not present

## 2022-03-24 DIAGNOSIS — Z79899 Other long term (current) drug therapy: Secondary | ICD-10-CM | POA: Diagnosis not present

## 2022-03-24 NOTE — Therapy (Signed)
OUTPATIENT PHYSICAL THERAPY TREATMENT NOTE   Patient Name: Megan Sutton MRN: [id number] DOB:[birth year], 76 y.o., female Today's Date: 03/24/2022  PCP: Sharilyn Sites REFERRING PROVIDER: Collene Mares, PA-C (he is no longer there so send all further communications to Dr Hilma Favors)  [place]:   PT End of Session - 03/24/22 0909     Visit Number 8    Number of Visits 18    Date for PT Re-Evaluation 04/16/22    Authorization Type [employer]    Authorization - Visit Number 7    Progress Note Due on Visit 10    PT Start Time 0915    PT Stop Time 1045    PT Time Calculation (min) 90 min                 Past Medical History:  Diagnosis Date   Cancer ([place])    Uterine cancer   Hypertension    Hypothyroidism    Past Surgical History:  Procedure Laterality Date   ABDOMINAL HYSTERECTOMY     CHOLECYSTECTOMY  1990   COLONOSCOPY N/A 05/13/2016   Procedure: COLONOSCOPY;  Surgeon: Daneil Dolin, MD;  Location: [facility];  Service: Endoscopy;  Laterality: N/A;  11:30 Am   Left inguinal hernia repair     TONSILLECTOMY     Patient Active Problem List   Diagnosis Date Noted   Diverticulosis of colon without hemorrhage     REFERRING DIAG: Lymphedema bilateral LE's  THERAPY DIAG:  Lymphedema, not elsewhere classified  PERTINENT HISTORY: Bilateral knee arthritis, Uterine Cancer with hysterectomy 1999, falls  PRECAUTIONS: Falls, cellulitis  SUBJECTIVE: Pt states her leg is small.  Pt was able to remove her bandages independently and roll them prior to appt today. Pt states her legs are sore to touch but has no other pain   Are you having pain? Yes 4/10 LE B  pain scale given. Pain location: hip and wounds  Pain description: aching, soreness Aggravating factors: walking, increased activity Relieving factors: rest   OBJECTIVE: (objective measures completed at initial evaluation unless otherwise dated)       LYMPHEDEMA ASSESSMENTS:       LE LANDMARK  RIGHT 03/05/22 Right  03/14/22 Right 03/20/22  At groin      30 cm proximal to suprapatella      20 cm proximal to suprapatella 87.5 88.9 80  10 cm proximal to suprapatella 75.8 87 69  At midpatella / popliteal crease 69 68.8 61  30 cm proximal to floor at lateral plantar foot 56.8 44.6 47  20 cm proximal to floor at lateral plantar foot 41.5 31.5 33.4  10 cm proximal to floor at lateral plantar foot 33.4 27.8 27.2  Circumference of ankle/heel 37.3 33 33  5 cm proximal to 1st MTP joint 26.5 23.9 23.5  Across MTP joint 25.7 24.7 23.3  Around proximal great toe   9 8.3  (Blank rows = not tested)   LE LANDMARK LEFT 03/05/22 Left 03/14/22 Left 03/20/22  At groin      30 cm proximal to suprapatella      20 cm proximal to suprapatella 77.5 77.5 69  10 cm proximal to suprapatella 73.8 75.3 65  At midpatella / popliteal crease 60.6 59.8 55  30 cm proximal to floor at lateral plantar foot 56.3 45.8 43.2  20 cm proximal to floor at lateral plantar foot 43.8 23 33.2  10 cm proximal to floor at lateral plantar foot 34.6 29.6 28  Circumference  of ankle/heel 37.3 31.6 31.5  5 cm proximal to 1st MTP joint 26.3 24.8 23.3  Across MTP joint 25   23.8 23.4  Around proximal great toe   9 8.5  (Blank rows = not tested)       TODAY'S TREATMENT  03/24/22: Bed mobility min assist supine to/from sit, Rt rolling Manual Lymph Drainage for bil LE's including short neck, deep and superficial abdominals.  Inguinal axillary anastomosis for bil LE's completed anterior In supine Moisturized LE's prior to rebandaging Compression bandaging full LE's to thigh using 1/2" foam and multilayer short stretch; hybrid technique combination roman sandal and HAS.  Thigh bandaged in standing  03/20/22: Short stretch removal Volumetric measurements of LE's (see above) Inspected wounds, covered upper thighs with ABD, sacral wound photographed Bed mobility mod assist supine to/from sit, Rt rolling Compression bandaging full  LE's to thigh using 1/2" foam and multilayer short stretch; hybrid technique combination roman sandal and HAS.  Thigh bandaged and measured in standing today with rest breaks needed  5/11/09/21: Therapist took removed the short stretch, which was given to support staff to roll.  Therapist cleansed and moisturized LE liberally.  Fungal cream                  Placed on posterior aspect of knee.  Therapist able to complete manual decongestive techniques to include:short neck, deep and superficial abdominal lymph nodes, bil LE inguinal-axillary anastomosis and bilateral LE's with pt semi supine, head raised.  Able to complete posterior aspect side lying as pt has improved mobility.  This is the first time therapist has observed pressure ulcers.  Multiple ulcers B upper thighs with deep purple indicative of deep tissue damage.  Pt friend states these have been there form months and months, maybe years.  Therapist put xeroform, ab pad and medipore tape to cover wounds on posterior aspect of thigh, Compression bandaging comes just below this level.                 Compression bandaging: Distal bil LE's 1/2" foam, and multilayer short stretch bandages. Therapist used hybird technique with 6 cm to complete both roman sandal and HAS to see if decreased pressure will assist in decreasing complaint of numbness of feet.  Therapist able to bandage thigh with pt standing today.  Next treatment therapist should be able to stand for thigh measurement.                03/17/22:             Therapist cut foam for thighs.  Liberally applied lotion to LE and Lotramin to posterior aspect of knees.  Treatment was done with pt             In sitting position.               Manual Supine:  short neck, deep and superficial abdominal lymph nodes, bil LE inguinal-axillary anastomosis and bilateral LE's Compression bandaging: Distal bil LE's 1/2" foam, and multilayer short stretch bandages.              03/14/22:  Reviewed self manual,  encouraged to bring fungi cream to address irritated skin posterior knee.               Manual Supine:  short neck, deep and superficial abdominal lymph nodes, bil LE inguinal-axillary anastomosis and bilateral LE's Compression bandaging: Distal bil LE's with profore and 1/2" foam, therapist cut pressure relieving areas into foam and  added More cotton to attempt to improve pt tolerance.  PATIENT EDUCATION:  Education details: 5/18:  See clinical impression:  need for more indepth care for pressure wounds and aggressive therapy for improving mobility 5/8:  Goals for lymphatic therapy, reminder to order short stretch compression, Informed she would be hearing from pump representative 5/3:Four aspects of treatment skin care, exercise, diaphragm breathing and lymphatic press  Person educated: Patient and friend Education method: Explanation, Demonstration, Tactile cues, Verbal cues, and Handouts Education comprehension: verbalized understanding, returned demonstration, and needs further education     HOME EXERCISE PROGRAM: ankle pumps, LAQ, hip ab/adduction, hip flexion, diaphragm breathing and lymph press.      ASSESSMENT:   CLINICAL IMPRESSION: Pt returns today with bandages rolled with noted reduction in edema in LE's.  Pt able to mobilize to and from supine with less help from therapist today.  Manual lymph drainage completed in supine with only induration remaining in Rt medial thigh skin fold/nodule.  Pt did report the bandage slid down under this and was uncomfortable.  Therapist cut additional piece of foam to place beneath this to see if this keeps bandage up. Pt does have appt with dermatogist Wednesday and primary next Wednesday.  Pt/ CG to discuss what next route of care should be to heal pressure ulcers as believes and inpatient/skilled stay would be best at this point. Wounds continue to drain with little success getting the right bandaging/pressure relief at this point.  Order for  juxtafit resent to Dr Hilma Favors as informed by patient that their referring PA is no longer at this practice.  ABD pads placed over upper posterior thigh wounds and may try a sacral pad next time if draining through clothes.  Pt will continue to benefit from skilled therapy.  OBJECTIVE IMPAIRMENTS decreased activity tolerance, decreased balance, decreased mobility, difficulty walking, increased edema, pain, and skin integrithy  .    ACTIVITY LIMITATIONS cleaning, community activity, driving, laundry, and shopping.    PERSONAL FACTORS Age are also affecting patient's functional outcome.      REHAB POTENTIAL: Good   CLINICAL DECISION MAKING: Evolving/moderate complexity   EVALUATION COMPLEXITY: Moderate   GOALS: Goals reviewed with patient? Yes   SHORT TERM GOALS: Target date: 03/26/2022   Pt to have lost 3 cm in thigh area; 2 in LE to reduce risk of cellulitis  Baseline: Goal status: MET  LONG TERM GOALS: Target date: 04/16/2022   PT to have lost 6 cm in thigh area; 3-4 in LE to reduce risk of cellulitis Baseline:  Goal status: ONGOING   2.  Pt to be able to walk 70 ft in a minute to demonstrate improved mobility  Baseline:  Goal status: ONGOING   3.  PT to be completing exercises on a regular basis.  Baseline:  Goal status: ONGOING   4.  PT to have and be using her compression pump Baseline:  Goal status: ONGOING   5.  PT to have compression ware for her LE for maintenance phase  Baseline:  Goal status: ONGOING   PLAN: PT FREQUENCY: 3x/week   PT DURATION: 6 weeks   PLANNED INTERVENTIONS: Therapeutic exercises, Patient/Family education, Manual lymph drainage, and Compression bandaging   PLAN FOR NEXT SESSION:  Continue with manual and compression bandaging; Returns to MD next Thursday and will see if pt decides to get more intense care in inpatient setting.   Check on order for juxtafit to be returned and give instructions on how to get this from [facility]  pharmacy.  Teena Irani, PTA/CLT [place] Ph: [phone]  03/24/2022, 11:30AM

## 2022-03-26 ENCOUNTER — Ambulatory Visit (HOSPITAL_COMMUNITY): Payer: PPO | Admitting: Physical Therapy

## 2022-03-26 DIAGNOSIS — I890 Lymphedema, not elsewhere classified: Secondary | ICD-10-CM

## 2022-03-26 NOTE — Therapy (Signed)
OUTPATIENT PHYSICAL THERAPY TREATMENT NOTE   Patient Name: Megan Sutton MRN: [id number] DOB:04-18-1946, 76 y.o., female Today's Date: 03/26/2022  PCP: Assunta Found REFERRING PROVIDER: Lenise Herald, PA-C (he is no longer there so send all further communications to Dr Phillips Odor)  END OF SESSION:   PT End of Session - 03/26/22 1634     Visit Number 9    Number of Visits 18    Date for PT Re-Evaluation 04/16/22    Authorization Type [employer]    Authorization - Visit Number 8    Progress Note Due on Visit 10    PT Start Time 1008    PT Stop Time 1138    PT Time Calculation (min) 90 min                 Past Medical History:  Diagnosis Date   Cancer ([facility])    Uterine cancer   Hypertension    Hypothyroidism    Past Surgical History:  Procedure Laterality Date   ABDOMINAL HYSTERECTOMY     CHOLECYSTECTOMY  1990   COLONOSCOPY N/A 05/13/2016   Procedure: COLONOSCOPY;  Surgeon: Corbin Ade, MD;  Location: [facility];  Service: Endoscopy;  Laterality: N/A;  11:30 Am   Left inguinal hernia repair     TONSILLECTOMY     Patient Active Problem List   Diagnosis Date Noted   Diverticulosis of colon without hemorrhage     REFERRING DIAG: Lymphedema bilateral LE's  THERAPY DIAG:  Lymphedema, not elsewhere classified  PERTINENT HISTORY: Bilateral knee arthritis, Uterine Cancer with hysterectomy 1999, falls  PRECAUTIONS: Falls, cellulitis  SUBJECTIVE: Pt reports the extra piece of foam placed on medial Rt thigh really helped.  Pt requests to discontinue placing a bandage on upper thigh wounds as it makes them sting.  STates she has a dressing given to her by her deramatologist that she puts on it and has an appointment there tomorrow.  Pt also reports the pump is coming tomorrow evening.  Pt is pleased with her progress and CG reports she was able to get her legs into the car by herself today.  Pt continues to remove her bandages independently and roll them  prior to appt. Pt states her legs are sore to touch but has no other pain   Are you having pain? Yes 3-4/10 LE B  pain scale given. Pain location: LE with touching, pressing Pain description: aching, soreness Aggravating factors: walking, increased activity, pressure Relieving factors: rest   OBJECTIVE: (objective measures completed at initial evaluation unless otherwise dated)       LYMPHEDEMA ASSESSMENTS:       LE LANDMARK RIGHT 03/05/22 Right  03/14/22 Right 03/20/22  At groin      30 cm proximal to suprapatella      20 cm proximal to suprapatella 87.5 88.9 80  10 cm proximal to suprapatella 75.8 87 69  At midpatella / popliteal crease 69 68.8 61  30 cm proximal to floor at lateral plantar foot 56.8 44.6 47  20 cm proximal to floor at lateral plantar foot 41.5 31.5 33.4  10 cm proximal to floor at lateral plantar foot 33.4 27.8 27.2  Circumference of ankle/heel 37.3 33 33  5 cm proximal to 1st MTP joint 26.5 23.9 23.5  Across MTP joint 25.7 24.7 23.3  Around proximal great toe   9 8.3  (Blank rows = not tested)   LE LANDMARK LEFT 03/05/22 Left 03/14/22 Left 03/20/22  At groin  30 cm proximal to suprapatella      20 cm proximal to suprapatella 77.5 77.5 69  10 cm proximal to suprapatella 73.8 75.3 65  At midpatella / popliteal crease 60.6 59.8 55  30 cm proximal to floor at lateral plantar foot 56.3 45.8 43.2  20 cm proximal to floor at lateral plantar foot 43.8 23 33.2  10 cm proximal to floor at lateral plantar foot 34.6 29.6 28  Circumference of ankle/heel 37.3 31.6 31.5  5 cm proximal to 1st MTP joint 26.3 24.8 23.3  Across MTP joint 25   23.8 23.4  Around proximal great toe   9 8.5  (Blank rows = not tested)       TODAY'S TREATMENT  03/26/22: Bed mobility min assist supine to/from sit, Rt rolling Manual Lymph Drainage for bil LE's including short neck, deep and superficial abdominals.  Inguinal axillary anastomosis for bil LE's completed anterior In  supine Moisturized LE's prior to rebandaging Compression bandaging full LE's to thigh using 1/2" foam and multilayer short stretch; hybrid technique combination roman sandal and HAS.  Thigh bandaged in standing   03/24/22: Bed mobility min assist supine to/from sit, Rt rolling Manual Lymph Drainage for bil LE's including short neck, deep and superficial abdominals.  Inguinal axillary anastomosis for bil LE's completed anterior In supine Moisturized LE's prior to rebandaging Compression bandaging full LE's to thigh using 1/2" foam and multilayer short stretch; hybrid technique combination roman sandal and HAS.  Thigh bandaged in standing  03/20/22: Short stretch removal Volumetric measurements of LE's (see above) Inspected wounds, covered upper thighs with ABD, sacral wound photographed Bed mobility mod assist supine to/from sit, Rt rolling Compression bandaging full LE's to thigh using 1/2" foam and multilayer short stretch; hybrid technique combination roman sandal and HAS.  Thigh bandaged and measured in standing today with rest breaks needed               PATIENT EDUCATION:  Education details: 5/18:  See clinical impression:  need for more indepth care for pressure wounds and aggressive therapy for improving mobility 5/8:  Goals for lymphatic therapy, reminder to order short stretch compression, Informed she would be hearing from pump representative 5/3:Four aspects of treatment skin care, exercise, diaphragm breathing and lymphatic press  Person educated: Patient and friend Education method: Explanation, Demonstration, Tactile cues, Verbal cues, and Handouts Education comprehension: verbalized understanding, returned demonstration, and needs further education     HOME EXERCISE PROGRAM: ankle pumps, LAQ, hip ab/adduction, hip flexion, diaphragm breathing and lymph press.      ASSESSMENT:   CLINICAL IMPRESSION: Continued reduction in edema in LE's visualized today. Improved ability  to get into CG's car for appt today but states she will need help after being wrapped.  Pt given signed order for juxta and instructions to call [facility] and make an appointment for fitting.  Manual lymph drainage completed in supine with only induration remaining in Rt medial thigh skin fold/nodule, however this improved and reduced from last visit.  Wounds continue to drain with little success getting the right bandaging/pressure relief at this point but returns to dermatologist tomorrow.  Pt will continue to benefit from skilled therapy with weekly measurements completed either Thursday or Friday.  OBJECTIVE IMPAIRMENTS decreased activity tolerance, decreased balance, decreased mobility, difficulty walking, increased edema, pain, and skin integrithy  .    ACTIVITY LIMITATIONS cleaning, community activity, driving, laundry, and shopping.    PERSONAL FACTORS Age are also affecting patient's functional outcome.  REHAB POTENTIAL: Good   CLINICAL DECISION MAKING: Evolving/moderate complexity   EVALUATION COMPLEXITY: Moderate   GOALS: Goals reviewed with patient? Yes   SHORT TERM GOALS: Target date: 03/26/2022   Pt to have lost 3 cm in thigh area; 2 in LE to reduce risk of cellulitis  Baseline: Goal status: MET  LONG TERM GOALS: Target date: 04/16/2022   PT to have lost 6 cm in thigh area; 3-4 in LE to reduce risk of cellulitis Baseline:  Goal status: ONGOING   2.  Pt to be able to walk 70 ft in a minute to demonstrate improved mobility  Baseline:  Goal status: ONGOING   3.  PT to be completing exercises on a regular basis.  Baseline:  Goal status: ONGOING   4.  PT to have and be using her compression pump Baseline:  Goal status: ONGOING   5.  PT to have compression ware for her LE for maintenance phase  Baseline:  Goal status: ONGOING   PLAN: PT FREQUENCY: 3x/week   PT DURATION: 6 weeks   PLANNED INTERVENTIONS: Therapeutic exercises, Patient/Family education, Manual  lymph drainage, and Compression bandaging   PLAN FOR NEXT SESSION:  Continue with manual and compression bandaging; Returns to Dermatologist tomorrow and primary MD next Thursday and will see if pt decides to get more intense care in inpatient setting.   F/U if juxta appointments was made at [facility] on Friday.  Teena Irani, PTA/CLT [place] Ph: [phone] 03/26/2022, 11:30AM

## 2022-03-27 DIAGNOSIS — L138 Other specified bullous disorders: Secondary | ICD-10-CM | POA: Diagnosis not present

## 2022-03-28 ENCOUNTER — Encounter (HOSPITAL_COMMUNITY): Payer: Self-pay

## 2022-03-28 ENCOUNTER — Ambulatory Visit (HOSPITAL_COMMUNITY): Payer: PPO

## 2022-03-28 DIAGNOSIS — I890 Lymphedema, not elsewhere classified: Secondary | ICD-10-CM

## 2022-03-28 NOTE — Therapy (Signed)
OUTPATIENT PHYSICAL THERAPY TREATMENT NOTE Progress Note Reporting Period 03/05/22 to 03/28/22  See note below for Objective Data and Assessment of Progress/Goals.      Patient Name: Megan Sutton MRN: [id number] DOB:12/03/1945, 76 y.o., female Today's Date: 03/28/2022  PCP: Sharilyn Sites REFERRING PROVIDER: Collene Mares, PA-C (he is no longer there so send all further communications to Dr Hilma Favors)  [place]:   PT End of Session - 03/28/22 1748     Visit Number 10    Number of Visits 18    Date for PT Re-Evaluation 04/16/22    Authorization Type [employer]    Authorization - Visit Number 9    Progress Note Due on Visit 10    PT Start Time 1450    PT Stop Time 1623    PT Time Calculation (min) 93 min    Activity Tolerance Patient tolerated treatment well    Behavior During Therapy WFL for tasks assessed/performed                  Past Medical History:  Diagnosis Date   Cancer ([place])    Uterine cancer   Hypertension    Hypothyroidism    Past Surgical History:  Procedure Laterality Date   ABDOMINAL HYSTERECTOMY     CHOLECYSTECTOMY  1990   COLONOSCOPY N/A 05/13/2016   Procedure: COLONOSCOPY;  Surgeon: Daneil Dolin, MD;  [facility]: [facility];  Service: Endoscopy;  Laterality: N/A;  11:30 Am   Left inguinal hernia repair     TONSILLECTOMY     Patient Active Problem List   Diagnosis Date Noted   Diverticulosis of colon without hemorrhage     REFERRING DIAG: Lymphedema bilateral LE's  THERAPY DIAG:  Lymphedema, not elsewhere classified  PERTINENT HISTORY: Bilateral knee arthritis, Uterine Cancer with hysterectomy 1999, falls  PRECAUTIONS: Falls, cellulitis  SUBJECTIVE:   Saw dermatologist yesterday who agreeds with recommendation to go to living center to address pressure for wound healing.  Has apt with PCP next Wednesday.    Pt reports the extra piece of foam placed on medial Rt thigh really helped.  Pt requests to discontinue  placing a bandage on upper thigh wounds as it makes them sting.  STates she has a dressing given to her by her deramatologist that she puts on it and has an appointment there tomorrow.  Pt also reports the pump is coming tomorrow evening.  Pt is pleased with her progress and CG reports she was able to get her legs into the car by herself today.  Pt continues to remove her bandages independently and roll them prior to appt. Pt states her legs are sore to touch but has no other pain   Are you having pain? Yes 3-4/10 LE B  pain scale given. Pain location: LE with touching, pressing Pain description: aching, soreness Aggravating factors: walking, increased activity, pressure Relieving factors: rest   OBJECTIVE: (objective measures completed at initial evaluation unless otherwise dated)       LYMPHEDEMA ASSESSMENTS:       LE LANDMARK RIGHT 03/05/22 Right  03/14/22 Right 03/20/22 Right 03/28/22  At groin       30 cm proximal to suprapatella       20 cm proximal to suprapatella 87.5 88.9 80 77.2  10 cm proximal to suprapatella 75.8 87 69 64.5  At midpatella / popliteal crease 69 68.8 61 58  30 cm proximal to floor at lateral plantar foot 56.8 44.6 47 44.6  20  cm proximal to floor at lateral plantar foot 41.5 31.5 33.4 30.5  10 cm proximal to floor at lateral plantar foot 33.4 27.8 27.2 26.5  Circumference of ankle/heel 37.3 33 33 33.2  5 cm proximal to 1st MTP joint 26.5 23.9 23.5 24  Across MTP joint 25.7 24.7 23.3 23.6  Around proximal great toe   9 8.3 8.2  (Blank rows = not tested)   LE LANDMARK LEFT 03/05/22 Left 03/14/22 Left 03/20/22 Left  03/28/22  At groin       30 cm proximal to suprapatella       20 cm proximal to suprapatella 77.5 77.5 69 70  10 cm proximal to suprapatella 73.8 75.3 65 65.8  At midpatella / popliteal crease 60.6 59.8 55 53.8  30 cm proximal to floor at lateral plantar foot 56.3 45.8 43.2 44.5  20 cm proximal to floor at lateral plantar foot 43.8 23 33.2 33.2   10 cm proximal to floor at lateral plantar foot 34.6 29.6 28 27.8  Circumference of ankle/heel 37.3 31.6 31.5 32  5 cm proximal to 1st MTP joint 26.3 24.8 23.3 23  Across MTP joint 25   23.8 23.4 23  Around proximal great toe   9 8.5 8.2  (Blank rows = not tested)       TODAY'S TREATMENT  03/28/22: Bed mobility increased time to complete though able to do independently Measurements Manual Lymph Drainage for bil LE's including short neck, deep and superficial abdominals.  Inguinal axillary anastomosis for bil LE's completed anterior In supine Moisturized LE's prior to rebandaging Compression bandaging full LE's to thigh using 1/2" foam and multilayer short stretch; thigh bandages in standing 2MWT with short stretch on at EOS, able to ambulate 47f with RW   03/26/22: Bed mobility min assist supine to/from sit, Rt rolling Manual Lymph Drainage for bil LE's including short neck, deep and superficial abdominals.  Inguinal axillary anastomosis for bil LE's completed anterior In supine Moisturized LE's prior to rebandaging Compression bandaging full LE's to thigh using 1/2" foam and multilayer short stretch; hybrid technique combination roman sandal and HAS.  Thigh bandaged in standing   03/24/22: Bed mobility min assist supine to/from sit, Rt rolling Manual Lymph Drainage for bil LE's including short neck, deep and superficial abdominals.  Inguinal axillary anastomosis for bil LE's completed anterior In supine Moisturized LE's prior to rebandaging Compression bandaging full LE's to thigh using 1/2" foam and multilayer short stretch; hybrid technique combination roman sandal and HAS.  Thigh bandaged in standing  03/20/22: Short stretch removal Volumetric measurements of LE's (see above) Inspected wounds, covered upper thighs with ABD, sacral wound photographed Bed mobility mod assist supine to/from sit, Rt rolling Compression bandaging full LE's to thigh using 1/2" foam and multilayer  short stretch; hybrid technique combination roman sandal and HAS.  Thigh bandaged and measured in standing today with rest breaks needed               PATIENT EDUCATION:  Education details: 5/18:  See clinical impression:  need for more indepth care for pressure wounds and aggressive therapy for improving mobility 5/8:  Goals for lymphatic therapy, reminder to order short stretch compression, Informed she would be hearing from pump representative 5/3:Four aspects of treatment skin care, exercise, diaphragm breathing and lymphatic press  Person educated: Patient and friend Education method: Explanation, Demonstration, Tactile cues, Verbal cues, and Handouts Education comprehension: verbalized understanding, returned demonstration, and needs further education     HOME EXERCISE  PROGRAM: ankle pumps, LAQ, hip ab/adduction, hip flexion, diaphragm breathing and lymph press.      ASSESSMENT:   CLINICAL IMPRESSION: Measurements taken with good reduction in proximal extremities, plato more distally.  Pt present with improved mobility with no assistance required with bed mobility and able to place feet on stool independently.  Manual decongestive techniques complete in supine with only induration remaining Rt medial thigh fold/nodule.  Applied heavy layer of lotion prior application of short stretch bandages.  Pt able to stand during thigh application with 1 rest breaks between legs.  Pt saw dermatologist yesterday and recommending going to skilled care center to address wound on buttocks.  Pt with PCP apt next week and has apt set for [facility] next Thursday for Juxt[employer] measurements.  Reports she received pump yesterday though has not began yet.  Plans to complete this weekend while washing short stretch bandages.  Pt educated to ride pump with dressings removed, verbalized understanding.  Noted long toe nails, pt stated she has a lady that comes to clean her home once a month that trims her nails when there  but has been unable to meet her due to apts.  Encouraged pt to look into podiatrist to address nails.    OBJECTIVE IMPAIRMENTS decreased activity tolerance, decreased balance, decreased mobility, difficulty walking, increased edema, pain, and skin integrithy  .    ACTIVITY LIMITATIONS cleaning, community activity, driving, laundry, and shopping.    PERSONAL FACTORS Age are also affecting patient's functional outcome.      REHAB POTENTIAL: Good   CLINICAL DECISION MAKING: Evolving/moderate complexity   EVALUATION COMPLEXITY: Moderate   GOALS: Goals reviewed with patient? Yes   SHORT TERM GOALS: Target date: 03/26/2022   Pt to have lost 3 cm in thigh area; 2 in LE to reduce risk of cellulitis  Baseline: Goal status: MET  LONG TERM GOALS: Target date: 04/16/2022   PT to have lost 6 cm in thigh area; 3-4 in LE to reduce risk of cellulitis Baseline:  Goal status: MET   2.  Pt to be able to walk 70 ft in a minute to demonstrate improved mobility  Baseline: 2MWT complete at EOS, able to ambulate 6f with RW and short stretch bandages on that does reduce cadence. Goal status: ONGOING   3.  PT to be completing exercises on a regular basis.  Baseline: 03/28/22:  Compliance with HEP daily Goal status: ONGOING   4.  PT to have and be using her compression pump Baseline:  5/26:  received pump yesterday, plans to begin this weekend Goal status: ONGOING   5.  PT to have compression ware for her LE for maintenance phase  Baseline: 03/28/22:  [facility] Sansapt Thurdsay Goal status: ONGOING   PLAN: PT FREQUENCY: 3x/week   PT DURATION: 6 weeks   PLANNED INTERVENTIONS: Therapeutic exercises, Patient/Family education, Manual lymph drainage, and Compression bandaging   PLAN FOR NEXT SESSION:  Continue with manual and compression bandaging;  [facility] apt next Thursday, PCP apt next week and will see if pt decides to get more intense care in inpatient setting.  Measure on Friday.  F/U with podiatrist  apt.  CIhor Austin LPTA/CLT; CLinn CreekCRayetta Humphrey PBunkieCLT [phone] 03/28/2022, 11:30AM

## 2022-04-01 ENCOUNTER — Ambulatory Visit (HOSPITAL_COMMUNITY): Payer: PPO | Admitting: Physical Therapy

## 2022-04-01 DIAGNOSIS — I890 Lymphedema, not elsewhere classified: Secondary | ICD-10-CM | POA: Diagnosis not present

## 2022-04-01 NOTE — Therapy (Signed)
OUTPATIENT PHYSICAL THERAPY TREATMENT NOTE Progress Note Reporting Period 03/05/22 to 03/28/22  See note below for Objective Data and Assessment of Progress/Goals.      Patient Name: Megan Sutton MRN: [id number] DOB:25-Oct-1946, 76 y.o., female Today's Date: 04/01/2022  PCP: Sharilyn Sites REFERRING PROVIDER: Collene Mares, PA-C (he is no longer there so send all further communications to Dr Hilma Favors)  [place]:   PT End of Session - 04/01/22 0911     Visit Number 11    Number of Visits 18    Date for PT Re-Evaluation 04/16/22    Authorization Type [employer]    Authorization - Visit Number 11    Progress Note Due on Visit 18    PT Start Time 0915    PT Stop Time 1045    PT Time Calculation (min) 90 min    Activity Tolerance Patient tolerated treatment well                  Past Medical History:  Diagnosis Date   Cancer ([place])    Uterine cancer   Hypertension    Hypothyroidism    Past Surgical History:  Procedure Laterality Date   ABDOMINAL HYSTERECTOMY     CHOLECYSTECTOMY  1990   COLONOSCOPY N/A 05/13/2016   Procedure: COLONOSCOPY;  Surgeon: Daneil Dolin, MD;  [facility];  Service: Endoscopy;  Laterality: N/A;  11:30 Am   Left inguinal hernia repair     TONSILLECTOMY     Patient Active Problem List   Diagnosis Date Noted   Diverticulosis of colon without hemorrhage     REFERRING DIAG: Lymphedema bilateral LE's  THERAPY DIAG:  Lymphedema, not elsewhere classified  PERTINENT HISTORY: Bilateral knee arthritis, Uterine Cancer with hysterectomy 1999, falls  PRECAUTIONS: Falls, cellulitis  SUBJECTIVE:   PT doing well; can tell that her mobility has improved.  Still sleeping in recliner.  Pt states that she used her pump for the first time and Was able to get it on and off herself.  Going Thursday to get fitted for juxta fit. PT got her toenails cut on $Remo'Sunday.   Are you having pain? Yes 3-4/10 LE B  pain scale given. Pain  location: LE with touching, pressing Pain description: aching, soreness Aggravating factors: walking, increased activity, pressure Relieving factors: rest   OBJECTIVE: (objective measures completed at initial evaluation unless otherwise dated)       LYMPHEDEMA ASSESSMENTS:       LE LANDMARK RIGHT 03/05/22 Right  03/14/22 Right 03/20/22 Right 03/28/22  At groin       30 cm proximal to suprapatella       20 cm proximal to suprapatella 87.5 88.9 80 77.2  10 cm proximal to suprapatella 75.8 87 69 64.5  At midpatella / popliteal crease 69 68.8 61 58  30 cm proximal to floor at lateral plantar foot 56.8 44.6 47 44.6  20 cm proximal to floor at lateral plantar foot 41.5 31.5 33.4 30.5  10 cm proximal to floor at lateral plantar foot 33.4 27.8 27.2 26.5  Circumference of ankle/heel 37.3 33 33 33.2  5 cm proximal to 1st MTP joint 26.5 23.9 23.5 24  Across MTP joint 25.7 24.7 23.3 23.6  Around proximal great toe   9 8.3 8.2  (Blank rows = not tested)   LE LANDMARK LEFT 03/05/22 Left 03/14/22 Left 03/20/22 Left  03/28/22  At groin       30'JikIb$  cm proximal to suprapatella  20 cm proximal to suprapatella 77.5 77.5 69 70  10 cm proximal to suprapatella 73.8 75.3 65 65.8  At midpatella / popliteal crease 60.6 59.8 55 53.8  30 cm proximal to floor at lateral plantar foot 56.3 45.8 43.2 44.5  20 cm proximal to floor at lateral plantar foot 43.8 23 33.2 33.2  10 cm proximal to floor at lateral plantar foot 34.6 29.6 28 27.8  Circumference of ankle/heel 37.3 31.6 31.5 32  5 cm proximal to 1st MTP joint 26.3 24.8 23.3 23  Across MTP joint 25   23.8 23.4 23  Around proximal great toe   9 8.5 8.2  (Blank rows = not tested)       TODAY'STREATMENT  04/01/22: Bed mobility min assist supine to/from sit, Rt rolling Manual Lymph Drainage for bil LE's including short neck, deep and superficial abdominals.  Inguinal axillary anastomosis for bil LE's completed anterior In supine;posterior in  sidelying Moisturized LE's prior to rebandaging Compression bandaging full LE's to thigh using 1/2" foam and multilayer short stretch; hybrid technique combination roman sandal and HAS.  Thigh bandaged in standing    03/28/22: Bed mobility increased time to complete though able to do independently Measurements Manual Lymph Drainage for bil LE's including short neck, deep and superficial abdominals.  Inguinal axillary anastomosis for bil LE's completed anterior In supine Moisturized LE's prior to rebandaging Compression bandaging full LE's to thigh using 1/2" foam and multilayer short stretch; thigh bandages in standing 2MWT with short stretch on at EOS, able to ambulate 61ft with RW   03/26/22: Bed mobility min assist supine to/from sit, Rt rolling Manual Lymph Drainage for bil LE's including short neck, deep and superficial abdominals.  Inguinal axillary anastomosis for bil LE's completed anterior In supine Moisturized LE's prior to rebandaging Compression bandaging full LE's to thigh using 1/2" foam and multilayer short stretch; hybrid technique combination roman sandal and HAS.  Thigh bandaged in standing   03/24/22: Bed mobility min assist supine to/from sit, Rt rolling Manual Lymph Drainage for bil LE's including short neck, deep and superficial abdominals.  Inguinal axillary anastomosis for bil LE's completed anterior In supine Moisturized LE's prior to rebandaging Compression bandaging full LE's to thigh using 1/2" foam and multilayer short stretch; hybrid technique combination roman sandal and HAS.  Thigh bandaged in standing  03/20/22: Short stretch removal Volumetric measurements of LE's (see above) Inspected wounds, covered upper thighs with ABD, sacral wound photographed Bed mobility mod assist supine to/from sit, Rt rolling Compression bandaging full LE's to thigh using 1/2" foam and multilayer short stretch; hybrid technique combination roman sandal and HAS.  Thigh bandaged  and measured in standing today with rest breaks needed               PATIENT EDUCATION:  Education details: 5/18:  See clinical impression:  need for more indepth care for pressure wounds and aggressive therapy for improving mobility 5/8:  Goals for lymphatic therapy, reminder to order short stretch compression, Informed she would be hearing from pump representative 5/3:Four aspects of treatment skin care, exercise, diaphragm breathing and lymphatic press  Person educated: Patient and friend Education method: Explanation, Demonstration, Tactile cues, Verbal cues, and Handouts Education comprehension: verbalized understanding, returned demonstration, and needs further education     HOME EXERCISE PROGRAM: ankle pumps, LAQ, hip ab/adduction, hip flexion, diaphragm breathing and lymph press.      ASSESSMENT:   CLINICAL IMPRESSION:  Significant improvement in decreased volume in LE as well as mobility.  PT LE are at maximum decreased volume where thighs continue to have induration.  Pt will continue to benefit from total decongestive techniques.    OBJECTIVE IMPAIRMENTS decreased activity tolerance, decreased balance, decreased mobility, difficulty walking, increased edema, pain, and skin integrithy  .    ACTIVITY LIMITATIONS cleaning, community activity, driving, laundry, and shopping.    PERSONAL FACTORS Age are also affecting patient's functional outcome.      REHAB POTENTIAL: Good   CLINICAL DECISION MAKING: Evolving/moderate complexity   EVALUATION COMPLEXITY: Moderate   GOALS: Goals reviewed with patient? Yes   SHORT TERM GOALS: Target date: 03/26/2022   Pt to have lost 3 cm in thigh area; 2 in LE to reduce risk of cellulitis  Baseline: Goal status: MET  LONG TERM GOALS: Target date: 04/16/2022   PT to have lost 6 cm in thigh area; 3-4 in LE to reduce risk of cellulitis Baseline:  Goal status: MET   2.  Pt to be able to walk 70 ft in a minute to demonstrate improved  mobility  Baseline: 2MWT complete at EOS, able to ambulate 75ft with RW and short stretch bandages on that does reduce cadence. Goal status: ONGOING   3.  PT to be completing exercises on a regular basis.  Baseline: 03/28/22:  Compliance with HEP daily Goal status: ONGOING   4.  PT to have and be using her compression pump Baseline:  5/26:  received pump yesterday, plans to begin this weekend Goal status: ONGOING   5.  PT to have compression ware for her LE for maintenance phase  Baseline: 03/28/22:  [facility] apt Thurdsay Goal status: ONGOING   PLAN: PT FREQUENCY: 3x/week   PT DURATION: 6 weeks   PLANNED INTERVENTIONS: Therapeutic exercises, Patient/Family education, Manual lymph drainage, and Compression bandaging   PLAN FOR NEXT SESSION:  Continue with manual and compression bandaging;  [facility] apt next Thursday, PCP apt next week and will see if pt decides to get more intense care in inpatient setting.  Measure on Friday.  F/U with podiatrist apt.   Rayetta Humphrey, [place] CLT [phone]  04/01/2022, 11:30AM

## 2022-04-02 ENCOUNTER — Encounter (HOSPITAL_COMMUNITY): Payer: PPO | Admitting: Physical Therapy

## 2022-04-02 ENCOUNTER — Ambulatory Visit (HOSPITAL_COMMUNITY): Payer: PPO | Attending: Family Medicine

## 2022-04-02 ENCOUNTER — Encounter (HOSPITAL_COMMUNITY): Payer: Self-pay

## 2022-04-02 DIAGNOSIS — I10 Essential (primary) hypertension: Secondary | ICD-10-CM | POA: Diagnosis not present

## 2022-04-02 DIAGNOSIS — I872 Venous insufficiency (chronic) (peripheral): Secondary | ICD-10-CM | POA: Diagnosis not present

## 2022-04-02 DIAGNOSIS — Z6841 Body Mass Index (BMI) 40.0 and over, adult: Secondary | ICD-10-CM | POA: Diagnosis not present

## 2022-04-02 DIAGNOSIS — E662 Morbid (severe) obesity with alveolar hypoventilation: Secondary | ICD-10-CM | POA: Diagnosis not present

## 2022-04-02 DIAGNOSIS — I890 Lymphedema, not elsewhere classified: Secondary | ICD-10-CM | POA: Insufficient documentation

## 2022-04-02 DIAGNOSIS — N8500 Endometrial hyperplasia, unspecified: Secondary | ICD-10-CM | POA: Diagnosis not present

## 2022-04-02 DIAGNOSIS — M1991 Primary osteoarthritis, unspecified site: Secondary | ICD-10-CM | POA: Diagnosis not present

## 2022-04-02 DIAGNOSIS — E782 Mixed hyperlipidemia: Secondary | ICD-10-CM | POA: Diagnosis not present

## 2022-04-02 DIAGNOSIS — M159 Polyosteoarthritis, unspecified: Secondary | ICD-10-CM | POA: Diagnosis not present

## 2022-04-02 DIAGNOSIS — Z7409 Other reduced mobility: Secondary | ICD-10-CM | POA: Diagnosis not present

## 2022-04-02 DIAGNOSIS — E039 Hypothyroidism, unspecified: Secondary | ICD-10-CM | POA: Diagnosis not present

## 2022-04-02 NOTE — Therapy (Signed)
OUTPATIENT PHYSICAL THERAPY TREATMENT NOTE Progress Note Reporting Period 03/05/22 to 03/28/22  See note below for Objective Data and Assessment of Progress/Goals.      Patient Name: Megan [facility] MRN: [id number] DOB:10/23/1946, 76 y.o., female Today's Date: 04/02/2022  PCP: Sharilyn Sites REFERRING PROVIDER: Collene Mares, PA-C (he is no longer there so send all further communications to Dr Hilma Favors)  [place]:   PT End of Session - 04/02/22 1253     Visit Number 12    Number of Visits 18    Date for PT Re-Evaluation 04/16/22    Authorization Type [employer]    Authorization - Visit Number 12    Progress Note Due on Visit 18    PT Start Time 1005    PT Stop Time 1135    PT Time Calculation (min) 90 min    Activity Tolerance Patient tolerated treatment well    Behavior During Therapy [facility] for tasks assessed/performed                  Past Medical History:  Diagnosis Date   Cancer ([place])    Uterine cancer   Hypertension    Hypothyroidism    Past Surgical History:  Procedure Laterality Date   ABDOMINAL HYSTERECTOMY     CHOLECYSTECTOMY  1990   COLONOSCOPY N/A 05/13/2016   Procedure: COLONOSCOPY;  Surgeon: Daneil Dolin, MD;  Location: [facility];  Service: Endoscopy;  Laterality: N/A;  11:30 Am   Left inguinal hernia repair     TONSILLECTOMY     Patient Active Problem List   Diagnosis Date Noted   Diverticulosis of colon without hemorrhage     REFERRING DIAG: Lymphedema bilateral LE's  THERAPY DIAG:  Lymphedema, not elsewhere classified  PERTINENT HISTORY: Bilateral knee arthritis, Uterine Cancer with hysterectomy 1999, falls  PRECAUTIONS: Falls, cellulitis  SUBJECTIVE:  Pt stated she has increased buttock sore pain today.  Goes to see MD later today, hoping she will be able to stay in inpatient to address buttocks sores.  Are you having pain? Yes 8/10 buttock  pain scale Pain location: LE with touching, pressing Pain description:  aching, soreness Aggravating factors: walking, increased activity, pressure Relieving factors: rest   OBJECTIVE: (objective measures completed at initial evaluation unless otherwise dated)       LYMPHEDEMA ASSESSMENTS:       LE LANDMARK RIGHT 03/05/22 Right  03/14/22 Right 03/20/22 Right 03/28/22  At groin       30 cm proximal to suprapatella       20 cm proximal to suprapatella 87.5 88.9 80 77.2  10 cm proximal to suprapatella 75.8 87 69 64.5  At midpatella / popliteal crease 69 68.8 61 58  30 cm proximal to floor at lateral plantar foot 56.8 44.6 47 44.6  20 cm proximal to floor at lateral plantar foot 41.5 31.5 33.4 30.5  10 cm proximal to floor at lateral plantar foot 33.4 27.8 27.2 26.5  Circumference of ankle/heel 37.3 33 33 33.2  5 cm proximal to 1st MTP joint 26.5 23.9 23.5 24  Across MTP joint 25.7 24.7 23.3 23.6  Around proximal great toe   9 8.3 8.2  (Blank rows = not tested)   LE LANDMARK LEFT 03/05/22 Left 03/14/22 Left 03/20/22 Left  03/28/22  At groin       30 cm proximal to suprapatella       20 cm proximal to suprapatella 77.5 77.5 69 70  10 cm proximal to  suprapatella 73.8 75.3 65 65.8  At midpatella / popliteal crease 60.6 59.8 55 53.8  30 cm proximal to floor at lateral plantar foot 56.3 45.8 43.2 44.5  20 cm proximal to floor at lateral plantar foot 43.8 23 33.2 33.2  10 cm proximal to floor at lateral plantar foot 34.6 29.6 28 27.8  Circumference of ankle/heel 37.3 31.6 31.5 32  5 cm proximal to 1st MTP joint 26.3 24.8 23.3 23  Across MTP joint 25   23.8 23.4 23  Around proximal great toe   9 8.5 8.2  (Blank rows = not tested)       TODAY'STREATMENT  04/02/22: Bed mobility min assist Rt LE onto mat sit <-> supine; increased time to complete Manual Lymph Drainage for bil LE's including short neck, deep and superficial abdominals.  Inguinal axillary anastomosis for bil LE's completed anterior In supine;posterior in sidelying Moisturized LE's prior to  rebandaging Compression bandaging full LE's to thigh using 1/2" foam and multilayer short stretch;  Thigh bandaged in standing  04/01/22: Bed mobility min assist supine to/from sit, Rt rolling Manual Lymph Drainage for bil LE's including short neck, deep and superficial abdominals.  Inguinal axillary anastomosis for bil LE's completed anterior In supine;posterior in sidelying Moisturized LE's prior to rebandaging Compression bandaging full LE's to thigh using 1/2" foam and multilayer short stretch; hybrid technique combination roman sandal and HAS.  Thigh bandaged in standing    03/28/22: Bed mobility increased time to complete though able to do independently Measurements Manual Lymph Drainage for bil LE's including short neck, deep and superficial abdominals.  Inguinal axillary anastomosis for bil LE's completed anterior In supine Moisturized LE's prior to rebandaging Compression bandaging full LE's to thigh using 1/2" foam and multilayer short stretch; thigh bandages in standing 2MWT with short stretch on at EOS, able to ambulate 44ft with RW   03/26/22: Bed mobility min assist supine to/from sit, Rt rolling Manual Lymph Drainage for bil LE's including short neck, deep and superficial abdominals.  Inguinal axillary anastomosis for bil LE's completed anterior In supine Moisturized LE's prior to rebandaging Compression bandaging full LE's to thigh using 1/2" foam and multilayer short stretch; hybrid technique combination roman sandal and HAS.  Thigh bandaged in standing   03/24/22: Bed mobility min assist supine to/from sit, Rt rolling Manual Lymph Drainage for bil LE's including short neck, deep and superficial abdominals.  Inguinal axillary anastomosis for bil LE's completed anterior In supine Moisturized LE's prior to rebandaging Compression bandaging full LE's to thigh using 1/2" foam and multilayer short stretch; hybrid technique combination roman sandal and HAS.  Thigh bandaged in  standing  03/20/22: Short stretch removal Volumetric measurements of LE's (see above) Inspected wounds, covered upper thighs with ABD, sacral wound photographed Bed mobility mod assist supine to/from sit, Rt rolling Compression bandaging full LE's to thigh using 1/2" foam and multilayer short stretch; hybrid technique combination roman sandal and HAS.  Thigh bandaged and measured in standing today with rest breaks needed               PATIENT EDUCATION:  Education details: 5/18:  See clinical impression:  need for more indepth care for pressure wounds and aggressive therapy for improving mobility 5/8:  Goals for lymphatic therapy, reminder to order short stretch compression, Informed she would be hearing from pump representative 5/3:Four aspects of treatment skin care, exercise, diaphragm breathing and lymphatic press  Person educated: Patient and friend Education method: Explanation, Demonstration, Tactile cues, Verbal cues, and  Handouts Education comprehension: verbalized understanding, returned demonstration, and needs further education     HOME EXERCISE PROGRAM: ankle pumps, LAQ, hip ab/adduction, hip flexion, diaphragm breathing and lymph press.      ASSESSMENT:   CLINICAL IMPRESSION:   Pt requested to be weight initially this session prior MD apt.  Weighed 266.4#, stated she has lost 20# since beginning therapy.  Pt has decreased volume BLE distally, continues to have induration present Rt thighs.    OBJECTIVE IMPAIRMENTS decreased activity tolerance, decreased balance, decreased mobility, difficulty walking, increased edema, pain, and skin integrithy  .    ACTIVITY LIMITATIONS cleaning, community activity, driving, laundry, and shopping.    PERSONAL FACTORS Age are also affecting patient's functional outcome.      REHAB POTENTIAL: Good   CLINICAL DECISION MAKING: Evolving/moderate complexity   EVALUATION COMPLEXITY: Moderate   GOALS: Goals reviewed with patient? Yes    SHORT TERM GOALS: Target date: 03/26/2022   Pt to have lost 3 cm in thigh area; 2 in LE to reduce risk of cellulitis  Baseline: Goal status: MET  LONG TERM GOALS: Target date: 04/16/2022   PT to have lost 6 cm in thigh area; 3-4 in LE to reduce risk of cellulitis Baseline:  Goal status: MET   2.  Pt to be able to walk 70 ft in a minute to demonstrate improved mobility  Baseline: 2MWT complete at EOS, able to ambulate 53ft with RW and short stretch bandages on that does reduce cadence. Goal status: ONGOING   3.  PT to be completing exercises on a regular basis.  Baseline: 03/28/22:  Compliance with HEP daily Goal status: ONGOING   4.  PT to have and be using her compression pump Baseline:  5/26:  received pump yesterday, plans to begin this weekend Goal status: ONGOING   5.  PT to have compression ware for her LE for maintenance phase  Baseline: 03/28/22:  Armida Sans apt Thurdsay Goal status: ONGOING   PLAN: PT FREQUENCY: 3x/week   PT DURATION: 6 weeks   PLANNED INTERVENTIONS: Therapeutic exercises, Patient/Family education, Manual lymph drainage, and Compression bandaging   PLAN FOR NEXT SESSION:  Continue with manual and compression bandaging;  [facility] apt on Thursday, PCP later today and will see if pt decides to get more intense care in inpatient setting.  Measure on Friday.    Ihor Austin, LPTA/CLT; Delana Meyer [phone]  04/02/2022, 11:30AM

## 2022-04-02 NOTE — Patient Instructions (Signed)
Bridge    Lie back, legs bent. Inhale, pressing hips up. Keeping ribs in, lengthen lower back. Exhale, rolling down along spine from top. Repeat 10 times. Do 3 sessions per day.  http://pm.exer.us/55   Copyright  [employer]. All rights reserved.

## 2022-04-04 ENCOUNTER — Encounter (HOSPITAL_COMMUNITY): Payer: Self-pay

## 2022-04-04 ENCOUNTER — Ambulatory Visit (HOSPITAL_COMMUNITY): Payer: PPO | Attending: Family Medicine

## 2022-04-04 ENCOUNTER — Encounter (HOSPITAL_COMMUNITY): Payer: PPO

## 2022-04-04 DIAGNOSIS — I890 Lymphedema, not elsewhere classified: Secondary | ICD-10-CM | POA: Insufficient documentation

## 2022-04-04 NOTE — Therapy (Signed)
OUTPATIENT PHYSICAL THERAPY TREATMENT NOTE  Patient Name: Megan Sutton MRN: [id number] DOB:[birth year], 76 y.o., female Today's Date: 04/04/2022  PCP: [facility] REFERRING PROVIDER: Collene Mares, PA-C (he is no longer there so send all further communications to Dr Megan Sutton)  [place]:   PT End of Session - 04/04/22 1448     Visit Number 13    Number of Visits 18    Date for PT Re-Evaluation 04/16/22    Authorization Type [facility]    Authorization - Visit Number 13    Progress Note Due on Visit 18    PT Start Time 1315    PT Stop Time 1443    PT Time Calculation (min) 88 min    Activity Tolerance Patient tolerated treatment well    Behavior During Therapy WFL for tasks assessed/performed                   Past Medical History:  Diagnosis Date   Cancer ([place])    Uterine cancer   Hypertension    Hypothyroidism    Past Surgical History:  Procedure Laterality Date   ABDOMINAL HYSTERECTOMY     CHOLECYSTECTOMY  1990   COLONOSCOPY N/A 05/13/2016   Procedure: COLONOSCOPY;  Surgeon: Megan Dolin, MD;  Location: [facility];  Service: Endoscopy;  Laterality: N/A;  11:30 Am   Left inguinal hernia repair     TONSILLECTOMY     Patient Active Problem List   Diagnosis Date Noted   Diverticulosis of colon without hemorrhage     REFERRING DIAG: Lymphedema bilateral LE's  THERAPY DIAG:  Lymphedema, not elsewhere classified  PERTINENT HISTORY: Bilateral knee arthritis, Uterine Cancer with hysterectomy 1999, falls  PRECAUTIONS: Falls, cellulitis  SUBJECTIVE:  Got measured at [facility] yesterday for Juxtafit, Tan told them they needed to verify insurance coverage then would order, should be availabe 2-3 weeks.  Megan Sutton stated she continues to have high pain on buttocks sores.  Saw MD earlier this week, pt going to have to talk to insurance and complete paperwork to see if eligible for inpatient care to address wounds.  (Noted bleeding onto sheeting this  session).  Has been without dressings since yesterday to get measured/  Are you having pain? Yes 8/10 buttock  pain scale Pain location: LE with touching, pressing Pain description: aching, soreness Aggravating factors: walking, increased activity, pressure Relieving factors: rest   OBJECTIVE: (objective measures completed at initial evaluation unless otherwise dated)       LYMPHEDEMA ASSESSMENTS:       LE LANDMARK RIGHT 03/05/22 Right  03/14/22 Right 03/20/22 Right 03/28/22 Right  04/04/22  At groin        30 cm proximal to suprapatella        20 cm proximal to suprapatella 87.5 88.9 80 77.2 80.4  10 cm proximal to suprapatella 75.8 87 69 64.5 65.5  At midpatella / popliteal crease 69 68.8 61 58 57.8  30 cm proximal to floor at lateral plantar foot 56.8 44.6 47 44.6 45.8  20 cm proximal to floor at lateral plantar foot 41.5 31.5 33.4 30.5 35.5  10 cm proximal to floor at lateral plantar foot 33.4 27.8 27.2 26.5 29.3  Circumference of ankle/heel 37.3 33 33 33.2 33.7  5 cm proximal to 1st MTP joint 26.5 23.9 23._0 Across MTP joint 25.7 24.7 23.3 23.6 23.6  Around proximal great toe   9 8.3 8.2 7.8  weight     5/31: 266.4  (  Blank rows = not tested)   LE LANDMARK LEFT 03/05/22 Left 03/14/22 Left 03/20/22 Left  03/28/22 Left 04/04/22  At groin        30 cm proximal to suprapatella        20 cm proximal to suprapatella 77.5 77.5 69 70 74.5  10 cm proximal to suprapatella 73.8 75.3 65 65.8 67  At midpatella / popliteal crease 60.6 59.8 55 53.8 57.8  30 cm proximal to floor at lateral plantar foot 56.3 45.8 43.2 44.5 42.4  20 cm proximal to floor at lateral plantar foot 43.8 23 33.2 33.2 34.5  10 cm proximal to floor at lateral plantar foot 34.6 29.6 28 27.8 30.5  Circumference of ankle/heel 37.3 31.6 31.5 32 33.8  5 cm proximal to 1st MTP joint 26.3 24.8 23._0 Across MTP joint 25   23.8 23.4 23 23.5  Around proximal great toe   9 8.5 8.2 8.1  (Blank rows = not  tested)       TODAY'STREATMENT  04/04/22: Measurements Pt independent with bed mobilty, labored movements and increased time. Manual lymph decongestive technqiues anterior only for bil LE's including short neck, deep and superficial abdominals.  Inguinal axillary anastomosis for bil LE's completed anterior In supine Compression bandaging full LE's to thigh using 1/2" foam and multilayer short stretch;  Thigh bandaged in standing   04/02/22: Bed mobility min assist Rt LE onto mat sit <-> supine; increased time to complete Manual Lymph Drainage for bil LE's including short neck, deep and superficial abdominals.  Inguinal axillary anastomosis for bil LE's completed anterior In supine;posterior in sidelying Moisturized LE's prior to rebandaging Compression bandaging full LE's to thigh using 1/2" foam and multilayer short stretch;  Thigh bandaged in standing  04/01/22: Bed mobility min assist supine to/from sit, Rt rolling Manual Lymph Drainage for bil LE's including short neck, deep and superficial abdominals.  Inguinal axillary anastomosis for bil LE's completed anterior In supine;posterior in sidelying Moisturized LE's prior to rebandaging Compression bandaging full LE's to thigh using 1/2" foam and multilayer short stretch; hybrid technique combination roman sandal and HAS.  Thigh bandaged in standing    03/28/22: Bed mobility increased time to complete though able to do independently Measurements Manual Lymph Drainage for bil LE's including short neck, deep and superficial abdominals.  Inguinal axillary anastomosis for bil LE's completed anterior In supine Moisturized LE's prior to rebandaging Compression bandaging full LE's to thigh using 1/2" foam and multilayer short stretch; thigh bandages in standing 2MWT with short stretch on at EOS, able to ambulate 49f with RW   03/26/22: Bed mobility min assist supine to/from sit, Rt rolling Manual Lymph Drainage for bil LE's including short  neck, deep and superficial abdominals.  Inguinal axillary anastomosis for bil LE's completed anterior In supine Moisturized LE's prior to rebandaging Compression bandaging full LE's to thigh using 1/2" foam and multilayer short stretch; hybrid technique combination roman sandal and HAS.  Thigh bandaged in standing   03/24/22: Bed mobility min assist supine to/from sit, Rt rolling Manual Lymph Drainage for bil LE's including short neck, deep and superficial abdominals.  Inguinal axillary anastomosis for bil LE's completed anterior In supine Moisturized LE's prior to rebandaging Compression bandaging full LE's to thigh using 1/2" foam and multilayer short stretch; hybrid technique combination roman sandal and HAS.  Thigh bandaged in standing  03/20/22: Short stretch removal Volumetric measurements of LE's (see above) Inspected wounds, covered upper thighs with ABD, sacral wound photographed Bed mobility  mod assist supine to/from sit, Rt rolling Compression bandaging full LE's to thigh using 1/2" foam and multilayer short stretch; hybrid technique combination roman sandal and HAS.  Thigh bandaged and measured in standing today with rest breaks needed               PATIENT EDUCATION:  Education details: 5/18:  See clinical impression:  need for more indepth care for pressure wounds and aggressive therapy for improving mobility 5/8:  Goals for lymphatic therapy, reminder to order short stretch compression, Informed she would be hearing from pump representative 5/3:Four aspects of treatment skin care, exercise, diaphragm breathing and lymphatic press  Person educated: Patient and friend Education method: Explanation, Demonstration, Tactile cues, Verbal cues, and Handouts Education comprehension: verbalized understanding, returned demonstration, and needs further education     HOME EXERCISE PROGRAM: ankle pumps, LAQ, hip ab/adduction, hip flexion, diaphragm breathing and lymph press.       ASSESSMENT:   CLINICAL IMPRESSION:   Pt presents with increased volume due to no compression since early yesterday to get measured for Juxtafit compression.  Manual lymph decongestive techniques complete anterior only due to time restraints.  Induration reduced though still present Rt medial thigh.  Pt presents with independent bed mobility, increased time and labored movements but no assistance required.  Able to stand and walk short duration without AD.  Did use RW while standing for thigh high bandages.   OBJECTIVE IMPAIRMENTS decreased activity tolerance, decreased balance, decreased mobility, difficulty walking, increased edema, pain, and skin integrithy  .    ACTIVITY LIMITATIONS cleaning, community activity, driving, laundry, and shopping.    PERSONAL FACTORS Age are also affecting patient's functional outcome.      REHAB POTENTIAL: Good   CLINICAL DECISION MAKING: Evolving/moderate complexity   EVALUATION COMPLEXITY: Moderate   GOALS: Goals reviewed with patient? Yes   SHORT TERM GOALS: Target date: 03/26/2022   Pt to have lost 3 cm in thigh area; 2 in LE to reduce risk of cellulitis  Baseline: Goal status: MET  LONG TERM GOALS: Target date: 04/16/2022   PT to have lost 6 cm in thigh area; 3-4 in LE to reduce risk of cellulitis Baseline:  Goal status: MET   2.  Pt to be able to walk 70 ft in a minute to demonstrate improved mobility  Baseline: 2MWT complete at EOS, able to ambulate 35f with RW and short stretch bandages on that does reduce cadence. Goal status: ONGOING   3.  PT to be completing exercises on a regular basis.  Baseline: 03/28/22:  Compliance with HEP daily Goal status: ONGOING   4.  PT to have and be using her compression pump Baseline:  5/26:  received pump yesterday, plans to begin this weekend Goal status: ONGOING   5.  PT to have compression ware for her LE for maintenance phase  Baseline: 03/28/22:  LArmida Sansapt Thurdsay Goal status: ONGOING    PLAN: PT FREQUENCY: 3x/week   PT DURATION: 6 weeks   PLANNED INTERVENTIONS: Therapeutic exercises, Patient/Family education, Manual lymph drainage, and Compression bandaging   PLAN FOR NEXT SESSION:  Continue with manual and compression bandaging; Measure on Friday.    CIhor Austin LPTA/CLT; CDelana [phone] 04/04/2022, 11:30AM

## 2022-04-07 ENCOUNTER — Ambulatory Visit (HOSPITAL_COMMUNITY): Payer: PPO | Attending: Physician Assistant | Admitting: Physical Therapy

## 2022-04-07 ENCOUNTER — Encounter (HOSPITAL_COMMUNITY): Payer: PPO | Admitting: Physical Therapy

## 2022-04-07 ENCOUNTER — Encounter (HOSPITAL_COMMUNITY): Payer: Self-pay | Admitting: Physical Therapy

## 2022-04-07 DIAGNOSIS — I890 Lymphedema, not elsewhere classified: Secondary | ICD-10-CM | POA: Diagnosis not present

## 2022-04-07 NOTE — Therapy (Signed)
OUTPATIENT PHYSICAL THERAPY TREATMENT NOTE  Patient Name: Megan Sutton MRN: [id number] DOB:Jul 16, 1946, 76 y.o., female Today's Date: 04/07/2022  PCP: Sharilyn Sites REFERRING PROVIDER: Collene Mares, PA-C (he is no longer there so send all further communications to Dr Hilma Favors)  [place]:   PT End of Session - 04/07/22 1445     Visit Number 14    Number of Visits 18    Date for PT Re-Evaluation 04/16/22    Authorization Type [employer]    Authorization - Visit Number 14    Progress Note Due on Visit 18    PT Start Time 1320    PT Stop Time 1440    PT Time Calculation (min) 80 min    Activity Tolerance Patient tolerated treatment well    Behavior During Therapy WFL for tasks assessed/performed                   Past Medical History:  Diagnosis Date   Cancer ([place])    Uterine cancer   Hypertension    Hypothyroidism    Past Surgical History:  Procedure Laterality Date   ABDOMINAL HYSTERECTOMY     CHOLECYSTECTOMY  1990   COLONOSCOPY N/A 05/13/2016   Procedure: COLONOSCOPY;  Surgeon: Daneil Dolin, MD;  Location: [facility];  Service: Endoscopy;  Laterality: N/A;  11:30 Am   Left inguinal hernia repair     TONSILLECTOMY     Patient Active Problem List   Diagnosis Date Noted   Diverticulosis of colon without hemorrhage     REFERRING DIAG: Lymphedema bilateral LE's  THERAPY DIAG:  Lymphedema, not elsewhere classified  PERTINENT HISTORY: Bilateral knee arthritis, Uterine Cancer with hysterectomy 1999, falls  PRECAUTIONS: Falls, cellulitis  SUBJECTIVE: Friend states she was able to get her legs into the car independently today.  Still checking on facilities vs home health to see what is going to work best for getting her pressure wounds healed.   States her legs are less tender and painful but her sacral wound is really hurting bad.  Are you having pain? Yes 8/10 buttock  pain scale Pain location: LE with touching, pressing Pain description:  aching, soreness Aggravating factors: walking, increased activity, pressure Relieving factors: rest   OBJECTIVE: (objective measures completed at initial evaluation unless otherwise dated)       LYMPHEDEMA ASSESSMENTS:       LE LANDMARK RIGHT 03/05/22 Right  03/14/22 Right 03/20/22 Right 03/28/22 Right  04/04/22  At groin        30 cm proximal to suprapatella        20 cm proximal to suprapatella 87.5 88.9 80 77.2 80.4  10 cm proximal to suprapatella 75.8 87 69 64.5 65.5  At midpatella / popliteal crease 69 68.8 61 58 57.8  30 cm proximal to floor at lateral plantar foot 56.8 44.6 47 44.6 45.8  20 cm proximal to floor at lateral plantar foot 41.5 31.5 33.4 30.5 35.5  10 cm proximal to floor at lateral plantar foot 33.4 27.8 27.2 26.5 29.3  Circumference of ankle/heel 37.3 33 33 33.2 33.7  5 cm proximal to 1st MTP joint 26.5 23.9 23.$RemoveBefo'5 24 24  'WtXdJiUNykP$ Across MTP joint 25.7 24.7 23.3 23.6 23.6  Around proximal great toe   9 8.3 8.2 7.8  weight     5/31: 266.4  (Blank rows = not tested)   LE LANDMARK LEFT 03/05/22 Left 03/14/22 Left 03/20/22 Left  03/28/22 Left 04/04/22  At groin  30 cm proximal to suprapatella        20 cm proximal to suprapatella 77.5 77.5 69 70 74.5  10 cm proximal to suprapatella 73.8 75.3 65 65.8 67  At midpatella / popliteal crease 60.6 59.8 55 53.8 57.8  30 cm proximal to floor at lateral plantar foot 56.3 45.8 43.2 44.5 42.4  20 cm proximal to floor at lateral plantar foot 43.8 23 33.2 33.2 34.5  10 cm proximal to floor at lateral plantar foot 34.6 29.6 28 27.8 30.5  Circumference of ankle/heel 37.3 31.6 31.5 32 33.8  5 cm proximal to 1st MTP joint 26.3 24.8 23.$RemoveBefo'3 23 24  'WTZRUCsUUeb$ Across MTP joint 25   23.8 23.4 23 23.5  Around proximal great toe   9 8.5 8.2 8.1  (Blank rows = not tested)       TODAY'STREATMENT   04/07/22: Pt independent with bed mobilty, labored movements and increased time. Manual lymph decongestive technqiues anterior only for bil LE's including  short neck, deep and superficial abdominals.  Inguinal axillary anastomosis for bil LE's completed anterior In supine Compression bandaging full LE's to thigh using 1/2" foam and multilayer short stretch;  Thigh bandaged in standing Application of mepilex sacral bandage  04/04/22: Measurements Pt independent with bed mobilty, labored movements and increased time. Manual lymph decongestive technqiues anterior only for bil LE's including short neck, deep and superficial abdominals.  Inguinal axillary anastomosis for bil LE's completed anterior In supine Compression bandaging full LE's to thigh using 1/2" foam and multilayer short stretch;  Thigh bandaged in standing   04/02/22: Bed mobility min assist Rt LE onto mat sit <-> supine; increased time to complete Manual Lymph Drainage for bil LE's including short neck, deep and superficial abdominals.  Inguinal axillary anastomosis for bil LE's completed anterior In supine;posterior in sidelying Moisturized LE's prior to rebandaging Compression bandaging full LE's to thigh using 1/2" foam and multilayer short stretch;  Thigh bandaged in standing  04/01/22: Bed mobility min assist supine to/from sit, Rt rolling Manual Lymph Drainage for bil LE's including short neck, deep and superficial abdominals.  Inguinal axillary anastomosis for bil LE's completed anterior In supine;posterior in sidelying Moisturized LE's prior to rebandaging Compression bandaging full LE's to thigh using 1/2" foam and multilayer short stretch; hybrid technique combination roman sandal and HAS.  Thigh bandaged in standing    03/28/22: Bed mobility increased time to complete though able to do independently Measurements Manual Lymph Drainage for bil LE's including short neck, deep and superficial abdominals.  Inguinal axillary anastomosis for bil LE's completed anterior In supine Moisturized LE's prior to rebandaging Compression bandaging full LE's to thigh using 1/2" foam and  multilayer short stretch; thigh bandages in standing 2MWT with short stretch on at EOS, able to ambulate 29ft with RW   03/26/22: Bed mobility min assist supine to/from sit, Rt rolling Manual Lymph Drainage for bil LE's including short neck, deep and superficial abdominals.  Inguinal axillary anastomosis for bil LE's completed anterior In supine Moisturized LE's prior to rebandaging Compression bandaging full LE's to thigh using 1/2" foam and multilayer short stretch; hybrid technique combination roman sandal and HAS.  Thigh bandaged in standing   03/24/22: Bed mobility min assist supine to/from sit, Rt rolling Manual Lymph Drainage for bil LE's including short neck, deep and superficial abdominals.  Inguinal axillary anastomosis for bil LE's completed anterior In supine Moisturized LE's prior to rebandaging Compression bandaging full LE's to thigh using 1/2" foam and multilayer short stretch; hybrid technique  combination roman sandal and HAS.  Thigh bandaged in standing  03/20/22: Short stretch removal Volumetric measurements of LE's (see above) Inspected wounds, covered upper thighs with ABD, sacral wound photographed Bed mobility mod assist supine to/from sit, Rt rolling Compression bandaging full LE's to thigh using 1/2" foam and multilayer short stretch; hybrid technique combination roman sandal and HAS.  Thigh bandaged and measured in standing today with rest breaks needed               PATIENT EDUCATION:  Education details: 5/18:  See clinical impression:  need for more indepth care for pressure wounds and aggressive therapy for improving mobility 5/8:  Goals for lymphatic therapy, reminder to order short stretch compression, Informed she would be hearing from pump representative 5/3:Four aspects of treatment skin care, exercise, diaphragm breathing and lymphatic press  Person educated: Patient and friend Education method: Explanation, Demonstration, Tactile cues, Verbal cues, and  Handouts Education comprehension: verbalized understanding, returned demonstration, and needs further education     HOME EXERCISE PROGRAM: ankle pumps, LAQ, hip ab/adduction, hip flexion, diaphragm breathing and lymph press.      ASSESSMENT:   CLINICAL IMPRESSION:   Manual lymph decongestive techniques complete anterior only due to time restraints.  Applied sacral mepilex bandage to larger sacral wound and instructed to remove if painful. Informed patient and friend to check [employer] to order these patches if they work for her.  Educated wounds should be covered in order to reduce chances of infection.  Pt has juxtafit ordered and is now waiting on either placement or home health according to what is available and what insurance will cover.  Informed to ask skilled facilities if they provide wound care and also will need to request pressure relief bed. Only indurated areas today is Rt medial thigh area and slight in Lt medial thigh.  Overall her mobility has improved. Pt will continue to benefit from skilled physical therapy to reduce edema and improve overall function.   OBJECTIVE IMPAIRMENTS decreased activity tolerance, decreased balance, decreased mobility, difficulty walking, increased edema, pain, and skin integrithy  .    ACTIVITY LIMITATIONS cleaning, community activity, driving, laundry, and shopping.    PERSONAL FACTORS Age are also affecting patient's functional outcome.      REHAB POTENTIAL: Good   CLINICAL DECISION MAKING: Evolving/moderate complexity   EVALUATION COMPLEXITY: Moderate   GOALS: Goals reviewed with patient? Yes   SHORT TERM GOALS: Target date: 03/26/2022   Pt to have lost 3 cm in thigh area; 2 in LE to reduce risk of cellulitis  Baseline: Goal status: MET  LONG TERM GOALS: Target date: 04/16/2022   PT to have lost 6 cm in thigh area; 3-4 in LE to reduce risk of cellulitis Baseline:  Goal status: MET   2.  Pt to be able to walk 70 ft in a minute to  demonstrate improved mobility  Baseline: 2MWT complete at EOS, able to ambulate 66ft with RW and short stretch bandages on that does reduce cadence. Goal status: ONGOING   3.  PT to be completing exercises on a regular basis.  Baseline: 03/28/22:  Compliance with HEP daily Goal status: ONGOING   4.  PT to have and be using her compression pump Baseline:  5/26:  received pump yesterday, plans to begin this weekend Goal status: ONGOING   5.  PT to have compression ware for her LE for maintenance phase  Baseline: 03/28/22:  Armida Sans apt Thurdsay Goal status: ONGOING   PLAN: PT FREQUENCY:  3x/week   PT DURATION: 6 weeks   PLANNED INTERVENTIONS: Therapeutic exercises, Patient/Family education, Manual lymph drainage, and Compression bandaging   PLAN FOR NEXT SESSION:  Continue with manual and compression bandaging; Measure on Friday.     Teena Irani, PTA/CLT [place] Ph: [phone]  04/07/2022, 11:30AM

## 2022-04-09 ENCOUNTER — Ambulatory Visit (HOSPITAL_COMMUNITY): Payer: PPO | Attending: Family Medicine

## 2022-04-09 ENCOUNTER — Encounter (HOSPITAL_COMMUNITY): Payer: Self-pay

## 2022-04-09 ENCOUNTER — Encounter (HOSPITAL_COMMUNITY): Payer: PPO | Admitting: Physical Therapy

## 2022-04-09 DIAGNOSIS — I890 Lymphedema, not elsewhere classified: Secondary | ICD-10-CM | POA: Insufficient documentation

## 2022-04-09 NOTE — Therapy (Signed)
OUTPATIENT PHYSICAL THERAPY TREATMENT NOTE  Patient Name: JUBILEE VIVERO MRN: [id number] DOB:May 18, 1946, 76 y.o., female Today's Date: 04/09/2022  PCP: Sharilyn Sites REFERRING PROVIDER: Collene Mares, PA-C (he is no longer there so send all further communications to Dr Hilma Favors)  [place]:   PT End of Session - 04/09/22 1047     Visit Number 15    Number of Visits 18    Date for PT Re-Evaluation 04/16/22    Authorization Type [employer]    Authorization - Visit Number 15    Progress Note Due on Visit 18    PT Start Time 0919    PT Stop Time 1043    PT Time Calculation (min) 84 min    Activity Tolerance Patient tolerated treatment well    Behavior During Therapy [facility] for tasks assessed/performed                    Past Medical History:  Diagnosis Date   Cancer ([place])    Uterine cancer   Hypertension    Hypothyroidism    Past Surgical History:  Procedure Laterality Date   ABDOMINAL HYSTERECTOMY     CHOLECYSTECTOMY  1990   COLONOSCOPY N/A 05/13/2016   Procedure: COLONOSCOPY;  Surgeon: Daneil Dolin, MD;  Location: [facility];  Service: Endoscopy;  Laterality: N/A;  11:30 Am   Left inguinal hernia repair     TONSILLECTOMY     Patient Active Problem List   Diagnosis Date Noted   Diverticulosis of colon without hemorrhage     REFERRING DIAG: Lymphedema bilateral LE's  THERAPY DIAG:  Lymphedema, not elsewhere classified  PERTINENT HISTORY: Bilateral knee arthritis, Uterine Cancer with hysterectomy 1999, falls  PRECAUTIONS: Falls, cellulitis  SUBJECTIVE: Friend states she was able to get her legs into the car independently today.  Still checking on facilities vs home health to see what is going to work best for getting her pressure wounds healed.   States her legs are less tender and painful but her sacral wound is really hurting bad.  Are you having pain? Yes 8/10 buttock  pain scale Pain location: LE with touching, pressing Pain  description: aching, soreness Aggravating factors: walking, increased activity, pressure Relieving factors: rest   OBJECTIVE: (objective measures completed at initial evaluation unless otherwise dated)       LYMPHEDEMA ASSESSMENTS:       LE LANDMARK RIGHT 03/05/22 Right  03/14/22 Right 03/20/22 Right 03/28/22 Right  04/04/22  At groin        30 cm proximal to suprapatella        20 cm proximal to suprapatella 87.5 88.9 80 77.2 80.4  10 cm proximal to suprapatella 75.8 87 69 64.5 65.5  At midpatella / popliteal crease 69 68.8 61 58 57.8  30 cm proximal to floor at lateral plantar foot 56.8 44.6 47 44.6 45.8  20 cm proximal to floor at lateral plantar foot 41.5 31.5 33.4 30.5 35.5  10 cm proximal to floor at lateral plantar foot 33.4 27.8 27.2 26.5 29.3  Circumference of ankle/heel 37.3 33 33 33.2 33.7  5 cm proximal to 1st MTP joint 26.5 23.9 23.$RemoveBefo'5 24 24  'zXwXetVAyPt$ Across MTP joint 25.7 24.7 23.3 23.6 23.6  Around proximal great toe   9 8.3 8.2 7.8  weight     5/31: 266.4  (Blank rows = not tested)   LE LANDMARK LEFT 03/05/22 Left 03/14/22 Left 03/20/22 Left  03/28/22 Left 04/04/22  At groin  30 cm proximal to suprapatella        20 cm proximal to suprapatella 77.5 77.5 69 70 74.5  10 cm proximal to suprapatella 73.8 75.3 65 65.8 67  At midpatella / popliteal crease 60.6 59.8 55 53.8 57.8  30 cm proximal to floor at lateral plantar foot 56.3 45.8 43.2 44.5 42.4  20 cm proximal to floor at lateral plantar foot 43.8 23 33.2 33.2 34.5  10 cm proximal to floor at lateral plantar foot 34.6 29.6 28 27.8 30.5  Circumference of ankle/heel 37.3 31.6 31.5 32 33.8  5 cm proximal to 1st MTP joint 26.3 24.8 23.$RemoveBefo'3 23 24  'SgUgSPMkpvL$ Across MTP joint 25   23.8 23.4 23 23.5  Around proximal great toe   9 8.5 8.2 8.1  (Blank rows = not tested)       TODAY'STREATMENT  04/09/22: Pt independent with bed mobilty, labored movements and increased time. Manual lymph decongestive technqiues for bil LE's including  short neck, deep and superficial abdominals.  Inguinal axillary anastomosis for bil LE's completed anterior in supine, posterior complete in sidelying position. Compression bandaging full LE's to thigh using 1/2" foam and multilayer short stretch;  Thigh bandaged in standing  04/07/22: Pt independent with bed mobilty, labored movements and increased time. Manual lymph decongestive technqiues anterior only for bil LE's including short neck, deep and superficial abdominals.  Inguinal axillary anastomosis for bil LE's completed anterior In supine Compression bandaging full LE's to thigh using 1/2" foam and multilayer short stretch;  Thigh bandaged in standing Application of mepilex sacral bandage  04/04/22: Measurements Pt independent with bed mobilty, labored movements and increased time. Manual lymph decongestive technqiues anterior only for bil LE's including short neck, deep and superficial abdominals.  Inguinal axillary anastomosis for bil LE's completed anterior In supine Compression bandaging full LE's to thigh using 1/2" foam and multilayer short stretch;  Thigh bandaged in standing   04/02/22: Bed mobility min assist Rt LE onto mat sit <-> supine; increased time to complete Manual Lymph Drainage for bil LE's including short neck, deep and superficial abdominals.  Inguinal axillary anastomosis for bil LE's completed anterior In supine;posterior in sidelying Moisturized LE's prior to rebandaging Compression bandaging full LE's to thigh using 1/2" foam and multilayer short stretch;  Thigh bandaged in standing  04/01/22: Bed mobility min assist supine to/from sit, Rt rolling Manual Lymph Drainage for bil LE's including short neck, deep and superficial abdominals.  Inguinal axillary anastomosis for bil LE's completed anterior In supine;posterior in sidelying Moisturized LE's prior to rebandaging Compression bandaging full LE's to thigh using 1/2" foam and multilayer short stretch; hybrid  technique combination roman sandal and HAS.  Thigh bandaged in standing    03/28/22: Bed mobility increased time to complete though able to do independently Measurements Manual Lymph Drainage for bil LE's including short neck, deep and superficial abdominals.  Inguinal axillary anastomosis for bil LE's completed anterior In supine Moisturized LE's prior to rebandaging Compression bandaging full LE's to thigh using 1/2" foam and multilayer short stretch; thigh bandages in standing 2MWT with short stretch on at EOS, able to ambulate 72ft with RW   03/26/22: Bed mobility min assist supine to/from sit, Rt rolling Manual Lymph Drainage for bil LE's including short neck, deep and superficial abdominals.  Inguinal axillary anastomosis for bil LE's completed anterior In supine Moisturized LE's prior to rebandaging Compression bandaging full LE's to thigh using 1/2" foam and multilayer short stretch; hybrid technique combination roman sandal and HAS.  Thigh  bandaged in standing   03/24/22: Bed mobility min assist supine to/from sit, Rt rolling Manual Lymph Drainage for bil LE's including short neck, deep and superficial abdominals.  Inguinal axillary anastomosis for bil LE's completed anterior In supine Moisturized LE's prior to rebandaging Compression bandaging full LE's to thigh using 1/2" foam and multilayer short stretch; hybrid technique combination roman sandal and HAS.  Thigh bandaged in standing  03/20/22: Short stretch removal Volumetric measurements of LE's (see above) Inspected wounds, covered upper thighs with ABD, sacral wound photographed Bed mobility mod assist supine to/from sit, Rt rolling Compression bandaging full LE's to thigh using 1/2" foam and multilayer short stretch; hybrid technique combination roman sandal and HAS.  Thigh bandaged and measured in standing today with rest breaks needed               PATIENT EDUCATION:  Education details: 5/18:  See clinical impression:   need for more indepth care for pressure wounds and aggressive therapy for improving mobility 5/8:  Goals for lymphatic therapy, reminder to order short stretch compression, Informed she would be hearing from pump representative 5/3:Four aspects of treatment skin care, exercise, diaphragm breathing and lymphatic press  Person educated: Patient and friend Education method: Explanation, Demonstration, Tactile cues, Verbal cues, and Handouts Education comprehension: verbalized understanding, returned demonstration, and needs further education     HOME EXERCISE PROGRAM: ankle pumps, LAQ, hip ab/adduction, hip flexion, diaphragm breathing and lymph press.      ASSESSMENT:   CLINICAL IMPRESSION:   Manual lymph decongestive techniques complete anterior in supine and posterior in sidelying.  Pt with improved speed with bed mobility and rolling this session compared to last.  Encouraged pt to try to return to bed at night, reports that is a personal goal, pt able to complete rolling both directions with no assistance or handrail options, discussed what it holding her back from returning.  Pt reports comfort with sacral mepilex bandages, friend/ride looked up cost of bandages during session, reports expensive though will look around for best cost.  Educated importance of keeping wounds covered to reduce infection.    OBJECTIVE IMPAIRMENTS decreased activity tolerance, decreased balance, decreased mobility, difficulty walking, increased edema, pain, and skin integrithy  .    ACTIVITY LIMITATIONS cleaning, community activity, driving, laundry, and shopping.    PERSONAL FACTORS Age are also affecting patient's functional outcome.      REHAB POTENTIAL: Good   CLINICAL DECISION MAKING: Evolving/moderate complexity   EVALUATION COMPLEXITY: Moderate   GOALS: Goals reviewed with patient? Yes   SHORT TERM GOALS: Target date: 03/26/2022   Pt to have lost 3 cm in thigh area; 2 in LE to reduce risk of  cellulitis  Baseline: Goal status: MET  LONG TERM GOALS: Target date: 04/16/2022   PT to have lost 6 cm in thigh area; 3-4 in LE to reduce risk of cellulitis Baseline:  Goal status: MET   2.  Pt to be able to walk 70 ft in a minute to demonstrate improved mobility  Baseline: 2MWT complete at EOS, able to ambulate 22ft with RW and short stretch bandages on that does reduce cadence. Goal status: ONGOING   3.  PT to be completing exercises on a regular basis.  Baseline: 03/28/22:  Compliance with HEP daily Goal status: ONGOING   4.  PT to have and be using her compression pump Baseline:  5/26:  received pump yesterday, plans to begin this weekend Goal status: ONGOING   5.  PT to have  compression ware for her LE for maintenance phase  Baseline: 03/28/22:  Armida Sans apt Thurdsay Goal status: ONGOING   PLAN: PT FREQUENCY: 3x/week   PT DURATION: 6 weeks   PLANNED INTERVENTIONS: Therapeutic exercises, Patient/Family education, Manual lymph drainage, and Compression bandaging   PLAN FOR NEXT SESSION:  Continue with manual and compression bandaging; Measure on Friday.     Ihor Austin, LPTA/CLT; Delana Meyer [phone] 04/09/2022, 11:30AM

## 2022-04-11 ENCOUNTER — Encounter (HOSPITAL_COMMUNITY): Payer: PPO | Admitting: Physical Therapy

## 2022-04-11 ENCOUNTER — Ambulatory Visit (HOSPITAL_COMMUNITY): Payer: PPO

## 2022-04-11 ENCOUNTER — Encounter (HOSPITAL_COMMUNITY): Payer: Self-pay

## 2022-04-11 DIAGNOSIS — I890 Lymphedema, not elsewhere classified: Secondary | ICD-10-CM

## 2022-04-11 NOTE — Therapy (Signed)
OUTPATIENT PHYSICAL THERAPY TREATMENT NOTE  Patient Name: Megan Sutton MRN: [id number] DOB:11/18/1945, 76 y.o., female Today's Date: 04/11/2022  PCP: Assunta Found REFERRING PROVIDER: Lenise Herald, PA-C (he is no longer there so send all further communications to Dr Phillips Odor)  END OF SESSION:   PT End of Session - 04/11/22 1001     Visit Number 16    Number of Visits 18    Date for PT Re-Evaluation 04/16/22    Authorization Type [employer]    Authorization - Visit Number 16    Progress Note Due on Visit 18    PT Start Time 0828    PT Stop Time 0955    PT Time Calculation (min) 87 min    Activity Tolerance Patient tolerated treatment well    Behavior During Therapy [facility] for tasks assessed/performed                    Past Medical History:  Diagnosis Date   Cancer (HCC)    Uterine cancer   Hypertension    Hypothyroidism    Past Surgical History:  Procedure Laterality Date   ABDOMINAL HYSTERECTOMY     CHOLECYSTECTOMY  1990   COLONOSCOPY N/A 05/13/2016   Procedure: COLONOSCOPY;  Surgeon: Corbin Ade, MD;  [facility];  Service: Endoscopy;  Laterality: N/A;  11:30 Am   Left inguinal hernia repair     TONSILLECTOMY     Patient Active Problem List   Diagnosis Date Noted   Diverticulosis of colon without hemorrhage     REFERRING DIAG: Lymphedema bilateral LE's  THERAPY DIAG:  Lymphedema, not elsewhere classified  PERTINENT HISTORY: Bilateral knee arthritis, Uterine Cancer with hysterectomy 1999, falls  PRECAUTIONS: Falls, cellulitis  SUBJECTIVE: Reports they found opening at [facility] that does cover wound care.  Reports she received call insurance won't cover Juxtafit unless she has a wound.  Stated they are going to talk to Tan at [facility] about an appeal.  Are you having pain? Yes 5/10 buttock  pain scale Pain location: LE with touching, pressing Pain description: aching, soreness Aggravating factors: walking, increased  activity, pressure Relieving factors: rest   OBJECTIVE: (objective measures completed at initial evaluation unless otherwise dated)       LYMPHEDEMA ASSESSMENTS:       LE LANDMARK RIGHT 03/05/22 Right  03/14/22 Right 03/20/22 Right 03/28/22 Right  04/04/22 Right 04/11/22  At groin         30 cm proximal to suprapatella         20 cm proximal to suprapatella 87.5 88.9 80 77.2 80.4 77.8  10 cm proximal to suprapatella 75.8 87 69 64.5 65.5 66  At midpatella / popliteal crease 69 68.8 61 58 57.8 55.6  30 cm proximal to floor at lateral plantar foot 56.8 44.6 47 44.6 45.8 47.3  20 cm proximal to floor at lateral plantar foot 41.5 31.5 33.4 30.5 35.5 32.8  10 cm proximal to floor at lateral plantar foot 33.4 27.8 27.2 26.5 29.3 26  Circumference of ankle/heel 37.3 33 33 33.2 33.7 33.4  5 cm proximal to 1st MTP joint 26.5 23.9 23.5 24 24  23.2  Across MTP joint 25.7 24.7 23.3 23.6 23.6 23.6  Around proximal great toe   9 8.3 8.2 7.8 7.8  weight     5/31: 266.4   (Blank rows = not tested)   LE LANDMARK LEFT 03/05/22 Left 03/14/22 Left 03/20/22 Left  03/28/22 Left 04/04/22 Left 04/11/22  At  groin         30 cm proximal to suprapatella         20 cm proximal to suprapatella 77.5 77.5 69 70 74.5 74.5  10 cm proximal to suprapatella 73.8 75.3 65 65.8 67 63.8  At midpatella / popliteal crease 60.6 59.8 55 53.8 57.8 54  30 cm proximal to floor at lateral plantar foot 56.3 45.8 43.2 44.5 42.4 42  20 cm proximal to floor at lateral plantar foot 43.8 23 33.2 33.2 34.5 32  10 cm proximal to floor at lateral plantar foot 34.6 29.6 28 27.8 30.5 27.4  Circumference of ankle/heel 37.3 31.6 31.5 32 33.$RemoveBe'8 31  5 'UQRBmHgTk$ cm proximal to 1st MTP joint 26.3 24.8 23.$RemoveBefo'3 23 24 'RbRgXKyjtAY$ 22.6  Across MTP joint 25   23.8 23.4 23 23.5 23  Around proximal great toe   9 8.5 8.2 8.1 8  (Blank rows = not tested)       TODAY'STREATMENT  04/11/22: Pt independent with bed mobilty, labored movements and increased  time. Measurements Manual lymph decongestive technqiues for bil LE's including short neck, deep and superficial abdominals.  Inguinal axillary anastomosis for bil LE's completed anterior in supine. Compression bandaging full LE's to thigh using 1/2" foam and multilayer short stretch;  Thigh bandaged in standing  04/09/22: Pt independent with bed mobilty, labored movements and increased time. Manual lymph decongestive technqiues for bil LE's including short neck, deep and superficial abdominals.  Inguinal axillary anastomosis for bil LE's completed anterior in supine, posterior complete in sidelying position. Compression bandaging full LE's to thigh using 1/2" foam and multilayer short stretch;  Thigh bandaged in standing  04/07/22: Pt independent with bed mobilty, labored movements and increased time. Manual lymph decongestive technqiues anterior only for bil LE's including short neck, deep and superficial abdominals.  Inguinal axillary anastomosis for bil LE's completed anterior In supine Compression bandaging full LE's to thigh using 1/2" foam and multilayer short stretch;  Thigh bandaged in standing Application of mepilex sacral bandage  04/04/22: Measurements Pt independent with bed mobilty, labored movements and increased time. Manual lymph decongestive technqiues anterior only for bil LE's including short neck, deep and superficial abdominals.  Inguinal axillary anastomosis for bil LE's completed anterior In supine Compression bandaging full LE's to thigh using 1/2" foam and multilayer short stretch;  Thigh bandaged in standing   04/02/22: Bed mobility min assist Rt LE onto mat sit <-> supine; increased time to complete Manual Lymph Drainage for bil LE's including short neck, deep and superficial abdominals.  Inguinal axillary anastomosis for bil LE's completed anterior In supine;posterior in sidelying Moisturized LE's prior to rebandaging Compression bandaging full LE's to thigh using 1/2"  foam and multilayer short stretch;  Thigh bandaged in standing  04/01/22: Bed mobility min assist supine to/from sit, Rt rolling Manual Lymph Drainage for bil LE's including short neck, deep and superficial abdominals.  Inguinal axillary anastomosis for bil LE's completed anterior In supine;posterior in sidelying Moisturized LE's prior to rebandaging Compression bandaging full LE's to thigh using 1/2" foam and multilayer short stretch; hybrid technique combination roman sandal and HAS.  Thigh bandaged in standing    03/28/22: Bed mobility increased time to complete though able to do independently Measurements Manual Lymph Drainage for bil LE's including short neck, deep and superficial abdominals.  Inguinal axillary anastomosis for bil LE's completed anterior In supine Moisturized LE's prior to rebandaging Compression bandaging full LE's to thigh using 1/2" foam and multilayer short stretch; thigh bandages in standing  with short stretch on at EOS, able to ambulate 57ft with RW   03/26/22: Bed mobility min assist supine to/from sit, Rt rolling Manual Lymph Drainage for bil LE's including short neck, deep and superficial abdominals.  Inguinal axillary anastomosis for bil LE's completed anterior In supine Moisturized LE's prior to rebandaging Compression bandaging full LE's to thigh using 1/2" foam and multilayer short stretch; hybrid technique combination roman sandal and HAS.  Thigh bandaged in standing   03/24/22: Bed mobility min assist supine to/from sit, Rt rolling Manual Lymph Drainage for bil LE's including short neck, deep and superficial abdominals.  Inguinal axillary anastomosis for bil LE's completed anterior In supine Moisturized LE's prior to rebandaging Compression bandaging full LE's to thigh using 1/2" foam and multilayer short stretch; hybrid technique combination roman sandal and HAS.  Thigh bandaged in standing  03/20/22: Short stretch removal Volumetric measurements  of LE's (see above) Inspected wounds, covered upper thighs with ABD, sacral wound photographed Bed mobility mod assist supine to/from sit, Rt rolling Compression bandaging full LE's to thigh using 1/2" foam and multilayer short stretch; hybrid technique combination roman sandal and HAS.  Thigh bandaged and measured in standing today with rest breaks needed               PATIENT EDUCATION:  Education details: 5/18:  See clinical impression:  need for more indepth care for pressure wounds and aggressive therapy for improving mobility 5/8:  Goals for lymphatic therapy, reminder to order short stretch compression, Informed she would be hearing from pump representative 5/3:Four aspects of treatment skin care, exercise, diaphragm breathing and lymphatic press  Person educated: Patient and friend Education method: Explanation, Demonstration, Tactile cues, Verbal cues, and Handouts Education comprehension: verbalized understanding, returned demonstration, and needs further education     HOME EXERCISE PROGRAM: ankle pumps, LAQ, hip ab/adduction, hip flexion, diaphragm breathing and lymph press.      ASSESSMENT:   CLINICAL IMPRESSION:   Measurements taken with overall reduction of volume.  Decreased induration with only present Rt medial thigh.  Manual lymph decongestive complete anterior aspect only due to time restraints.  Application of thigh layer of lotion prior multilayer short stretch bandages to Bil thigh with 1/2in foam.  Noted blood on some of the short stretch bandages, pt stated she noticed around ankle when removing dressings but unable to find opening on skin.  Pt plans to remove bandages on Sunday and wash/hang dry.  Pt encouraged to use pump Sunday and Monday every 6 hours.  Pt stated she got call insurance won't cover Juxtafit without wounds, Tan at [facility] plans to make appeal.  Pt plans to go ahead with or without insurance approval.  Stated they have openings at [facility]  that does wound care.  Pt waiting for Juxtafit then plans to complete paperwork for admission.  OBJECTIVE IMPAIRMENTS decreased activity tolerance, decreased balance, decreased mobility, difficulty walking, increased edema, pain, and skin integrithy  .    ACTIVITY LIMITATIONS cleaning, community activity, driving, laundry, and shopping.    PERSONAL FACTORS Age are also affecting patient's functional outcome.      REHAB POTENTIAL: Good   CLINICAL DECISION MAKING: Evolving/moderate complexity   EVALUATION COMPLEXITY: Moderate   GOALS: Goals reviewed with patient? Yes   SHORT TERM GOALS: Target date: 03/26/2022   Pt to have lost 3 cm in thigh area; 2 in LE to reduce risk of cellulitis  Baseline: Goal status: MET  LONG TERM GOALS: Target date: 04/16/2022  PT to have lost 6 cm in thigh area; 3-4 in LE to reduce risk of cellulitis Baseline:  Goal status: MET   2.  Pt to be able to walk 70 ft in a minute to demonstrate improved mobility  Baseline: 2MWT complete at EOS, able to ambulate 75ft with RW and short stretch bandages on that does reduce cadence. Goal status: ONGOING   3.  PT to be completing exercises on a regular basis.  Baseline: 03/28/22:  Compliance with HEP daily Goal status: ONGOING   4.  PT to have and be using her compression pump Baseline:  5/26:  received pump yesterday, plans to begin this weekend Goal status: ONGOING   5.  PT to have compression ware for her LE for maintenance phase  Baseline: 03/28/22:  Armida Sans apt Thurdsay Goal status: ONGOING   PLAN: PT FREQUENCY: 3x/week   PT DURATION: 6 weeks   PLANNED INTERVENTIONS: Therapeutic exercises, Patient/Family education, Manual lymph drainage, and Compression bandaging   PLAN FOR NEXT SESSION:  Continue with manual and compression bandaging; Measure on Friday.     Ihor Austin, LPTA/CLT; Delana Meyer [phone] 04/11/2022, 11:30AM

## 2022-04-14 ENCOUNTER — Encounter (HOSPITAL_COMMUNITY): Payer: PPO | Admitting: Physical Therapy

## 2022-04-15 ENCOUNTER — Encounter (HOSPITAL_COMMUNITY): Payer: Self-pay

## 2022-04-15 ENCOUNTER — Ambulatory Visit (HOSPITAL_COMMUNITY): Payer: PPO | Attending: Physician Assistant

## 2022-04-15 DIAGNOSIS — I890 Lymphedema, not elsewhere classified: Secondary | ICD-10-CM | POA: Diagnosis not present

## 2022-04-15 NOTE — Therapy (Signed)
OUTPATIENT PHYSICAL THERAPY TREATMENT NOTE  Patient Name: Megan Sutton MRN: [id number] DOB:1946-11-01, 76 y.o., female Today's Date: 04/15/2022  PCP: Sharilyn Sites REFERRING PROVIDER: Collene Mares, PA-C (he is no longer there so send all further communications to Dr Hilma Favors)  [place]:   PT End of Session - 04/15/22 1233     Visit Number 17    Number of Visits 18    Date for PT Re-Evaluation 04/16/22    Authorization Type [employer]    Authorization - Visit Number 16    Progress Note Due on Visit 18    PT Start Time 1010    PT Stop Time 1132    PT Time Calculation (min) 82 min    Activity Tolerance Patient tolerated treatment well    Behavior During Therapy WFL for tasks assessed/performed                    Past Medical History:  Diagnosis Date   Cancer ([place])    Uterine cancer   Hypertension    Hypothyroidism    Past Surgical History:  Procedure Laterality Date   ABDOMINAL HYSTERECTOMY     CHOLECYSTECTOMY  1990   COLONOSCOPY N/A 05/13/2016   Procedure: COLONOSCOPY;  Surgeon: Daneil Dolin, MD;  Location: [facility];  Service: Endoscopy;  Laterality: N/A;  11:30 Am   Left inguinal hernia repair     TONSILLECTOMY     Patient Active Problem List   Diagnosis Date Noted   Diverticulosis of colon without hemorrhage     REFERRING DIAG: Lymphedema bilateral LE's  THERAPY DIAG:  Lymphedema, not elsewhere classified  PERTINENT HISTORY: Bilateral knee arthritis, Uterine Cancer with hysterectomy 1999, falls  PRECAUTIONS: Falls, cellulitis  SUBJECTIVE: Pt stated she ordered Juxtafit.  Removed her bandages _0  cm proximal to suprapatella 87.5 88.9 80 77.2 80.4 77.8  10 cm proximal to suprapatella 75.8 87 69 64.5 65.5 66  At midpatella / popliteal crease 69 68.8 61 58 57.8 55.6  30 cm proximal to floor at lateral plantar foot 56.8 44.6 47 44.6 45.8 47.3  20 cm proximal to floor at lateral plantar foot 41.5 31.5 33.4 30.5 35.5 32.8  10 cm proximal to floor at lateral plantar foot 33.4 27.8 27.2 26.5 29.3 26  Circumference of ankle/heel 37.3 33 33 33.2 33.7 33.4  5 cm proximal to 1st MTP joint 26.5 23.9 23._1 23.2  Across MTP joint 25.7 24.7 23.3 23.6 23.6 23.6  Around proximal great toe   9 8.3 8.2 7.8 7.8  weight     5/31: 266.4   (Blank rows = not tested)   LE LANDMARK LEFT 03/05/22 Left 03/14/22 Left 03/20/22 Left  03/28/22 Left 04/04/22 Left 04/11/22  At groin  30 cm proximal to suprapatella         20 cm proximal to suprapatella 77.5 77.5 69 70 74.5 74.5  10 cm proximal to suprapatella 73.8 75.3 65 65.8 67 63.8  At midpatella / popliteal crease 60.6 59.8 55 53.8 57.8 54  30 cm proximal to floor at lateral plantar foot 56.3 45.8 43.2 44.5 42.4 42  20 cm proximal to floor at lateral plantar foot 43.8 23 33.2 33.2 34.5 32  10 cm proximal to floor at lateral plantar foot 34.6 29.6 28 27.8 30.5 27.4  Circumference of ankle/heel 37.3 31.6 31.5 32 33._0 cm proximal to 1st MTP joint 26.3 24.8 23._1 22.6  Across MTP joint 25   23.8 23.4 23 23.5 23  Around proximal great toe   9 8.5 8.2 8.1 8  (Blank rows = not tested)       TODAY'STREATMENT  04/15/22  Pt independent with bed mobilty, labored movements and increased time. Manual lymph  decongestive technqiues for bil LE's including short neck, deep and superficial abdominals.  Inguinal axillary anastomosis for bil LE's completed anterior in supine, posterior complete in sidelying position. Compression bandaging full LE's to thigh using 1/2" foam and multilayer short stretch;  Thigh bandaged in standing  04/11/22: Pt independent with bed mobilty, labored movements and increased time. Measurements Manual lymph decongestive technqiues for bil LE's including short neck, deep and superficial abdominals.  Inguinal axillary anastomosis for bil LE's completed anterior in supine. Compression bandaging full LE's to thigh using 1/2" foam and multilayer short stretch;  Thigh bandaged in standing  04/09/22: Pt independent with bed mobilty, labored movements and increased time. Manual lymph decongestive technqiues for bil LE's including short neck, deep and superficial abdominals.  Inguinal axillary anastomosis for bil LE's completed anterior in supine, posterior complete in sidelying position. Compression bandaging full LE's to thigh using 1/2" foam and multilayer short stretch;  Thigh bandaged in standing  04/07/22: Pt independent with bed mobilty, labored movements and increased time. Manual lymph decongestive technqiues anterior only for bil LE's including short neck, deep and superficial abdominals.  Inguinal axillary anastomosis for bil LE's completed anterior In supine Compression bandaging full LE's to thigh using 1/2" foam and multilayer short stretch;  Thigh bandaged in standing Application of mepilex sacral bandage  04/04/22: Measurements Pt independent with bed mobilty, labored movements and increased time. Manual lymph decongestive technqiues anterior only for bil LE's including short neck, deep and superficial abdominals.  Inguinal axillary anastomosis for bil LE's completed anterior In supine Compression bandaging full LE's to thigh using 1/2" foam and multilayer short stretch;   Thigh bandaged in standing   04/02/22: Bed mobility min assist Rt LE onto mat sit <-> supine; increased time to complete Manual Lymph Drainage for bil LE's including short neck, deep and superficial abdominals.  Inguinal axillary anastomosis for bil LE's completed anterior In supine;posterior in sidelying Moisturized LE's prior to rebandaging Compression bandaging full LE's to thigh using 1/2" foam and multilayer short stretch;  Thigh bandaged in standing  04/01/22: Bed mobility min assist supine to/from sit, Rt rolling Manual Lymph Drainage for bil LE's including short neck, deep and superficial abdominals.  Inguinal axillary anastomosis for bil LE's completed anterior In supine;posterior in sidelying Moisturized LE's prior to rebandaging Compression bandaging full LE's to thigh using 1/2" foam and multilayer short stretch; hybrid technique combination roman sandal and HAS.  Thigh bandaged in standing    03/28/22: Bed mobility increased time to complete  though able to do independently Measurements Manual Lymph Drainage for bil LE's including short neck, deep and superficial abdominals.  Inguinal axillary anastomosis for bil LE's completed anterior In supine Moisturized LE's prior to rebandaging Compression bandaging full LE's to thigh using 1/2" foam and multilayer short stretch; thigh bandages in standing 2MWT with short stretch on at EOS, able to ambulate 80ft with RW   03/26/22: Bed mobility min assist supine to/from sit, Rt rolling Manual Lymph Drainage for bil LE's including short neck, deep and superficial abdominals.  Inguinal axillary anastomosis for bil LE's completed anterior In supine Moisturized LE's prior to rebandaging Compression bandaging full LE's to thigh using 1/2" foam and multilayer short stretch; hybrid technique combination roman sandal and HAS.  Thigh bandaged in standing   03/24/22: Bed mobility min assist supine to/from sit, Rt rolling Manual Lymph Drainage for  bil LE's including short neck, deep and superficial abdominals.  Inguinal axillary anastomosis for bil LE's completed anterior In supine Moisturized LE's prior to rebandaging Compression bandaging full LE's to thigh using 1/2" foam and multilayer short stretch; hybrid technique combination roman sandal and HAS.  Thigh bandaged in standing  03/20/22: Short stretch removal Volumetric measurements of LE's (see above) Inspected wounds, covered upper thighs with ABD, sacral wound photographed Bed mobility mod assist supine to/from sit, Rt rolling Compression bandaging full LE's to thigh using 1/2" foam and multilayer short stretch; hybrid technique combination roman sandal and HAS.  Thigh bandaged and measured in standing today with rest breaks needed               PATIENT EDUCATION:  Education details: 5/18:  See clinical impression:  need for more indepth care for pressure wounds and aggressive therapy for improving mobility 5/8:  Goals for lymphatic therapy, reminder to order short stretch compression, Informed she would be hearing from pump representative 5/3:Four aspects of treatment skin care, exercise, diaphragm breathing and lymphatic press  Person educated: Patient and friend Education method: Explanation, Demonstration, Tactile cues, Verbal cues, and Handouts Education comprehension: verbalized understanding, returned demonstration, and needs further education     HOME EXERCISE PROGRAM: ankle pumps, LAQ, hip ab/adduction, hip flexion, diaphragm breathing and lymph press.      ASSESSMENT:   CLINICAL IMPRESSION:   Pt presents with improved bed mobility and standing tolerance with decreased time to complete and labored movements.  Manual lymph decongestive techniques complete anterior supine and posterior in sidlelying.  Noted blood on back on thigh and knee from rear wounds.  Cleansed and moistured LEs.  Reviewed importance of keeping wound covered.  Heather, pt's friend that brings her  to session looked online for covers, stated they are expensive but will continue to look.  Pt educated on importance of keeping off buttocks for pressure relief, stated her bed is too tall and scared to return to bed.  Encouraged to sleep on side vs back as much as possible.  Pt able to stand whole session while complete thigh application with no rest breaks required.  Pt has purchased Juxtafit, awaiting them then appropriate for DC to self care and will be admitted to [facility] for wound care once has.  OBJECTIVE IMPAIRMENTS decreased activity tolerance, decreased balance, decreased mobility, difficulty walking, increased edema, pain, and skin integrithy  .    ACTIVITY LIMITATIONS cleaning, community activity, driving, laundry, and shopping.    PERSONAL FACTORS Age are also affecting patient's functional outcome.      REHAB POTENTIAL: Good   CLINICAL DECISION MAKING: Evolving/moderate  complexity   EVALUATION COMPLEXITY: Moderate   GOALS: Goals reviewed with patient? Yes   SHORT TERM GOALS: Target date: 03/26/2022   Pt to have lost 3 cm in thigh area; 2 in LE to reduce risk of cellulitis  Baseline: Goal status: MET  LONG TERM GOALS: Target date: 04/16/2022   PT to have lost 6 cm in thigh area; 3-4 in LE to reduce risk of cellulitis Baseline:  Goal status: MET   2.  Pt to be able to walk 70 ft in a minute to demonstrate improved mobility  Baseline: 2MWT complete at EOS, able to ambulate 82f with RW and short stretch bandages on that does reduce cadence. Goal status: ONGOING   3.  PT to be completing exercises on a regular basis.  Baseline: 03/28/22:  Compliance with HEP daily Goal status: ONGOING   4.  PT to have and be using her compression pump Baseline:  5/26:  received pump yesterday, plans to begin this weekend Goal status: ONGOING   5.  PT to have compression ware for her LE for maintenance phase  Baseline: 03/28/22:  LArmida Sansapt Thurdsay Goal status: ONGOING    PLAN: PT FREQUENCY: 3x/week   PT DURATION: 6 weeks   PLANNED INTERVENTIONS: Therapeutic exercises, Patient/Family education, Manual lymph drainage, and Compression bandaging   PLAN FOR NEXT SESSION:  Continue with manual and compression bandaging; Measure next session with additional time per cert until received Juxtafit.   CIhor Austin LPTA/CLT; CDelana [phone]/13/2023, 11:30AM

## 2022-04-16 ENCOUNTER — Encounter (HOSPITAL_COMMUNITY): Payer: PPO | Admitting: Physical Therapy

## 2022-04-17 ENCOUNTER — Ambulatory Visit (HOSPITAL_COMMUNITY): Payer: PPO | Attending: Family Medicine | Admitting: Physical Therapy

## 2022-04-17 DIAGNOSIS — I890 Lymphedema, not elsewhere classified: Secondary | ICD-10-CM | POA: Diagnosis not present

## 2022-04-17 NOTE — Therapy (Addendum)
OUTPATIENT PHYSICAL THERAPY TREATMENT NOTE Progress Note Reporting Period 04/01/2022 to 04/17/2022  See note below for Objective Data and Assessment of Progress/Goals.     Patient Name: Megan Sutton MRN: [id number] DOB:01-26-1946, 76 y.o., female Today's Date: 04/17/2022  PCP: Sharilyn Sites REFERRING PROVIDER: Collene Mares, PA-C (he is no longer there so send all further communications to Dr Hilma Favors)  [place]:   PT End of Session - 04/17/22 1225     Visit Number 18    Number of Visits 24    Date for PT Re-Evaluation 05/02/22    Authorization Type [employer]    Authorization - Visit Number 17    Progress Note Due on Visit 24    PT Start Time 1050    PT Stop Time 1215    PT Time Calculation (min) 85 min    Activity Tolerance Patient tolerated treatment well    Behavior During Therapy WFL for tasks assessed/performed                     Past Medical History:  Diagnosis Date   Cancer ([place])    Uterine cancer   Hypertension    Hypothyroidism    Past Surgical History:  Procedure Laterality Date   ABDOMINAL HYSTERECTOMY     CHOLECYSTECTOMY  1990   COLONOSCOPY N/A 05/13/2016   Procedure: COLONOSCOPY;  Surgeon: Daneil Dolin, MD;  Location: [facility];  Service: Endoscopy;  Laterality: N/A;  11:30 Am   Left inguinal hernia repair     TONSILLECTOMY     Patient Active Problem List   Diagnosis Date Noted   Diverticulosis of colon without hemorrhage     REFERRING DIAG: Lymphedema bilateral LE's  THERAPY DIAG:  Lymphedema, not elsewhere classified  PERTINENT HISTORY: Bilateral knee arthritis, Uterine Cancer with hysterectomy 1999, falls  PRECAUTIONS: Falls, cellulitis  SUBJECTIVE: Pt states the Juxtafit should be in soon and then will see about placement for getting her wounds healed.  Pt has been using her pump when she removes her bandages.  States she has not tried getting up into her bed but knows she will have to try using a step stool  and without her bandaging on.  Much easier getting in and out of car and general mobility.  Are you having pain? Yes 7/10 buttock  pain scale Pain location: LE with touching, pressing Pain description: aching, soreness Aggravating factors: walking, increased activity, pressure Relieving factors: rest   OBJECTIVE: (objective measures completed at initial evaluation unless otherwise dated)       LYMPHEDEMA ASSESSMENTS:       LE LANDMARK RIGHT 03/05/22 Right  03/14/22 Right 03/20/22 Right 03/28/22 Right  04/04/22 Right 04/11/22 Right 04/17/22  At groin          30 cm proximal to suprapatella          20 cm proximal to suprapatella 87.5 88.9 80 77.2 80.4 77.8 78  10 cm proximal to suprapatella 75.8 87 69 64.5 65.5 66 67  At midpatella / popliteal crease 69 68.8 61 58 57.8 55.6 57  30 cm proximal to floor at lateral plantar foot 56.8 44.6 47 44.6 45.8 47.3 44.5  20 cm proximal to floor at lateral plantar foot 41.5 31.5 33.4 30.5 35.5 32._0 cm proximal to floor at lateral plantar foot 33.4 27.8 27.2 26.5 29._1 Circumference of ankle/heel 37.3 33 33 33.2 33.7 33.4 32.5  5 cm proximal to 1st MTP  joint 26.5 23.9 23._0 23.2 24  Across MTP joint 25.7 24.7 23.3 23.6 23.6 23.6 23  Around proximal great toe   9 8.3 8.2 7.8 7.8 8  weight     5/31: 266.4  262  (Blank rows = not tested)   LE LANDMARK LEFT 03/05/22 Left 03/14/22 Left 03/20/22 Left  03/28/22 Left 04/04/22 Left 04/11/22 Left 04/17/22  At groin          30 cm proximal to suprapatella          20 cm proximal to suprapatella 77.5 77.5 69 70 74.5 74.5 72  10 cm proximal to suprapatella 73.8 75.3 65 65.8 67 63.8 64  At midpatella / popliteal crease 60.6 59.8 55 53.8 57.8 54 55  30 cm proximal to floor at lateral plantar foot 56.3 45.8 43.2 44.5 42.4 42 41  20 cm proximal to floor at lateral plantar foot 43.8 23 33.2 33.2 34.5 32 31  10 cm proximal to floor at lateral plantar foot 34.6 29.6 28 27.8 30.5 27.4 27.5   Circumference of ankle/heel 37.3 31.6 31.5 32 33._1 cm proximal to 1st MTP joint 26.3 24.8 23._2 22.6 22.5  Across MTP joint 25   23.8 23.4 23 23.5 23 22.6  Around proximal great toe   9 8.5 8.2 8._3 (Blank rows = not tested)       TODAY'STREATMENT  04/17/22  Progress note completed Pt independent with bed mobilty, labored movements and increased time. Manual lymph decongestive technqiues for bil LE's including short neck, deep and superficial abdominals.  Inguinal axillary anastomosis for bil LE's completed anterior in supine, posterior complete in sidelying position. Measurements of bil LE's Compression bandaging full LE's to thigh using 1/2" foam and multilayer short stretch;  Thighs bandaged in standing  04/15/22  Pt independent with bed mobilty, labored movements and increased time. Manual lymph decongestive technqiues for bil LE's including short neck, deep and superficial abdominals.  Inguinal axillary anastomosis for bil LE's completed anterior in supine, posterior complete in sidelying position. Compression bandaging full LE's to thigh using 1/2" foam and multilayer short stretch;  Thigh bandaged in standing  04/11/22: Pt independent with bed mobilty, labored movements and increased time. Measurements Manual lymph decongestive technqiues for bil LE's including short neck, deep and superficial abdominals.  Inguinal axillary anastomosis for bil LE's completed anterior in supine. Compression bandaging full LE's to thigh using 1/2" foam and multilayer short stretch;  Thigh bandaged in standing  04/09/22: Pt independent with bed mobilty, labored movements and increased time. Manual lymph decongestive technqiues for bil LE's including short neck, deep and superficial abdominals.  Inguinal axillary anastomosis for bil LE's completed anterior in supine, posterior complete in sidelying position. Compression bandaging full LE's to thigh using 1/2" foam and multilayer short  stretch;  Thigh bandaged in standing  04/07/22: Pt independent with bed mobilty, labored movements and increased time. Manual lymph decongestive technqiues anterior only for bil LE's including short neck, deep and superficial abdominals.  Inguinal axillary anastomosis for bil LE's completed anterior In supine Compression bandaging full LE's to thigh using 1/2" foam and multilayer short stretch;  Thigh bandaged in standing Application of mepilex sacral bandage  04/04/22: Measurements Pt independent with bed mobilty, labored movements and increased time. Manual lymph decongestive technqiues anterior only for bil LE's including short neck, deep and superficial abdominals.  Inguinal axillary anastomosis for bil LE's completed anterior In supine Compression bandaging full LE's  to thigh using 1/2" foam and multilayer short stretch;  Thigh bandaged in standing   04/02/22: Bed mobility min assist Rt LE onto mat sit <-> supine; increased time to complete Manual Lymph Drainage for bil LE's including short neck, deep and superficial abdominals.  Inguinal axillary anastomosis for bil LE's completed anterior In supine;posterior in sidelying Moisturized LE's prior to rebandaging Compression bandaging full LE's to thigh using 1/2" foam and multilayer short stretch;  Thigh bandaged in standing  04/01/22: Bed mobility min assist supine to/from sit, Rt rolling Manual Lymph Drainage for bil LE's including short neck, deep and superficial abdominals.  Inguinal axillary anastomosis for bil LE's completed anterior In supine;posterior in sidelying Moisturized LE's prior to rebandaging Compression bandaging full LE's to thigh using 1/2" foam and multilayer short stretch; hybrid technique combination roman sandal and HAS.  Thigh bandaged in standing    03/28/22: Bed mobility increased time to complete though able to do independently Measurements Manual Lymph Drainage for bil LE's including short neck, deep and  superficial abdominals.  Inguinal axillary anastomosis for bil LE's completed anterior In supine Moisturized LE's prior to rebandaging Compression bandaging full LE's to thigh using 1/2" foam and multilayer short stretch; thigh bandages in standing 2MWT with short stretch on at EOS, able to ambulate 51f with RW   03/26/22: Bed mobility min assist supine to/from sit, Rt rolling Manual Lymph Drainage for bil LE's including short neck, deep and superficial abdominals.  Inguinal axillary anastomosis for bil LE's completed anterior In supine Moisturized LE's prior to rebandaging Compression bandaging full LE's to thigh using 1/2" foam and multilayer short stretch; hybrid technique combination roman sandal and HAS.  Thigh bandaged in standing   03/24/22: Bed mobility min assist supine to/from sit, Rt rolling Manual Lymph Drainage for bil LE's including short neck, deep and superficial abdominals.  Inguinal axillary anastomosis for bil LE's completed anterior In supine Moisturized LE's prior to rebandaging Compression bandaging full LE's to thigh using 1/2" foam and multilayer short stretch; hybrid technique combination roman sandal and HAS.  Thigh bandaged in standing  03/20/22: Short stretch removal Volumetric measurements of LE's (see above) Inspected wounds, covered upper thighs with ABD, sacral wound photographed Bed mobility mod assist supine to/from sit, Rt rolling Compression bandaging full LE's to thigh using 1/2" foam and multilayer short stretch; hybrid technique combination roman sandal and HAS.  Thigh bandaged and measured in standing today with rest breaks needed               PATIENT EDUCATION:  Education details: 5/18:  See clinical impression:  need for more indepth care for pressure wounds and aggressive therapy for improving mobility 5/8:  Goals for lymphatic therapy, reminder to order short stretch compression, Informed she would be hearing from pump representative 5/3:Four  aspects of treatment skin care, exercise, diaphragm breathing and lymphatic press  Person educated: Patient and friend Education method: Explanation, Demonstration, Tactile cues, Verbal cues, and Handouts Education comprehension: verbalized understanding, returned demonstration, and needs further education     HOME EXERCISE PROGRAM: ankle pumps, LAQ, hip ab/adduction, hip flexion, diaphragm breathing and lymph press.      ASSESSMENT:   CLINICAL IMPRESSION:   Progress note/recert completed this session.  Pt measured with mostly unchanged from last weeks, especially in distal LE's as she has decompressed well.  Pt continues to improve with general mobility, more at ease with bed mobility (without bandaging) and standing tolerance with decreased time to complete and labored movements.  Manual lymph  decongestive techniques complete anterior supine and posterior in sidlelying.  Moistured LEs well prior to rebandaging using short stretch. Encouraged to have assistance at her home when she attempts trying to get into her bed.  Continued to encouraged to sleep on side vs back as much as possible.  Pt able to stand whole session while complete thigh application with no rest breaks required.  Pt will continue to benefit from skilled lymphedema therapy until she receives her juxtafits and able to don/doff them independently.    OBJECTIVE IMPAIRMENTS decreased activity tolerance, decreased balance, decreased mobility, difficulty walking, increased edema, pain, and skin integrithy  .    ACTIVITY LIMITATIONS cleaning, community activity, driving, laundry, and shopping.    PERSONAL FACTORS Age are also affecting patient's functional outcome.      REHAB POTENTIAL: Good   CLINICAL DECISION MAKING: Evolving/moderate complexity   EVALUATION COMPLEXITY: Moderate   GOALS: Goals reviewed with patient? Yes   SHORT TERM GOALS: Target date: 03/26/2022   Pt to have lost 3 cm in thigh area; 2 in LE to reduce  risk of cellulitis  Baseline: Goal status: MET   LONG TERM GOALS: Target date: 04/16/2022   PT to have lost 6 cm in thigh area; 3-4 in LE to reduce risk of cellulitis Baseline:  Goal status: MET   2.  Pt to be able to walk 70 ft in a minute to demonstrate improved mobility  Baseline: 2MWT complete at EOS, able to ambulate 71f with RW and short stretch bandages on that does reduce cadence. Goal status: ONGOING   3.  PT to be completing exercises on a regular basis.  Baseline: 03/28/22:  Compliance with HEP daily Goal status: ONGOING   4.  PT to have and be using her compression pump Baseline:  received pump 03/27/22 Goal status: MET   5.  PT to have Juxtafit compression garments for her LE for maintenance phase  Baseline: 03/28/22:  LArmida Sansapt Thurdsay Goal status: ONGOING   PLAN: PT FREQUENCY: 3x/week   PT DURATION: 6 weeks for an additional 2 weeks 8 weeks only    PLANNED INTERVENTIONS: Therapeutic exercises, Patient/Family education, Manual lymph drainage, and Compression bandaging   PLAN FOR NEXT SESSION:  Continue with manual and compression bandaging until receives Juxtafit. Ensure she is able to don/doff garments independently.   ATeena Irani PTA/CLT CEwa VillagesPh: [phone] 04/17/2022, 11:30AM

## 2022-04-17 NOTE — Addendum Note (Signed)
Addended by: Leeroy Cha on: 04/17/2022 05:25 PM   Modules accepted: Orders

## 2022-04-18 ENCOUNTER — Encounter (HOSPITAL_COMMUNITY): Payer: Self-pay

## 2022-04-18 ENCOUNTER — Ambulatory Visit (HOSPITAL_COMMUNITY): Payer: PPO | Attending: Family Medicine

## 2022-04-18 ENCOUNTER — Encounter (HOSPITAL_COMMUNITY): Payer: PPO | Admitting: Physical Therapy

## 2022-04-18 DIAGNOSIS — I890 Lymphedema, not elsewhere classified: Secondary | ICD-10-CM

## 2022-04-18 NOTE — Therapy (Signed)
OUTPATIENT PHYSICAL THERAPY TREATMENT NOTE     Patient Name: Megan Sutton MRN: [id number] DOB:25-Jan-1946, 76 y.o., female Today's Date: 04/18/2022  PCP: Sharilyn Sites REFERRING PROVIDER: Collene Mares, PA-C (he is no longer there so send all further communications to Dr Hilma Favors)  [place]:   PT End of Session - 04/18/22 1221     Visit Number 19    Number of Visits 24    Date for PT Re-Evaluation 05/02/22    Authorization Type [employer]    Authorization - Visit Number 18    Progress Note Due on Visit 24    PT Start Time 0920    PT Stop Time 1043    PT Time Calculation (min) 83 min    Activity Tolerance Patient tolerated treatment well    Behavior During Therapy [facility] for tasks assessed/performed                      Past Medical History:  Diagnosis Date   Cancer ([place])    Uterine cancer   Hypertension    Hypothyroidism    Past Surgical History:  Procedure Laterality Date   ABDOMINAL HYSTERECTOMY     CHOLECYSTECTOMY  1990   COLONOSCOPY N/A 05/13/2016   Procedure: COLONOSCOPY;  Surgeon: Daneil Dolin, MD;  Location: [facility];  Service: Endoscopy;  Laterality: N/A;  11:30 Am   Left inguinal hernia repair     TONSILLECTOMY     Patient Active Problem List   Diagnosis Date Noted   Diverticulosis of colon without hemorrhage     REFERRING DIAG: Lymphedema bilateral LE's  THERAPY DIAG:  Lymphedema, not elsewhere classified  PERTINENT HISTORY: Bilateral knee arthritis, Uterine Cancer with hysterectomy 1999, falls  PRECAUTIONS: Falls, cellulitis  SUBJECTIVE:Pt stated buttocks is feeling better today.  Plans to call [facility] on Monday to ask about Juxtafit.  Heather ordered sarcal covers, supposed to arrive today.  Plans to stop by nursing home and pick up paperwork to complete and give to MD later today for wound care.  Pt stated she is tired today, began removing bandages at 4:30 this morning.  Are you having pain? Yes 4/10 buttock  pain  scale Pain location: LE with touching, pressing Pain description: aching, soreness Aggravating factors: walking, increased activity, pressure Relieving factors: rest   OBJECTIVE: (objective measures completed at initial evaluation unless otherwise dated)       LYMPHEDEMA ASSESSMENTS:       LE LANDMARK RIGHT 03/05/22 Right  03/14/22 Right 03/20/22 Right 03/28/22 Right  04/04/22 Right 04/11/22 Right 04/17/22  At groin          30 cm proximal to suprapatella          20 cm proximal to suprapatella 87.5 88.9 80 77.2 80.4 77.8 78  10 cm proximal to suprapatella 75.8 87 69 64.5 65.5 66 67  At midpatella / popliteal crease 69 68.8 61 58 57.8 55.6 57  30 cm proximal to floor at lateral plantar foot 56.8 44.6 47 44.6 45.8 47.3 44.5  20 cm proximal to floor at lateral plantar foot 41.5 31.5 33.4 30.5 35.5 32.$RemoveBefo'8 30  10 'MMRRxlKsKDF$ cm proximal to floor at lateral plantar foot 33.4 27.8 27.2 26.5 29.$RemoveBefo'3 26 27  'oaizjXCLqOQ$ Circumference of ankle/heel 37.3 33 33 33.2 33.7 33.4 32.5  5 cm proximal to 1st MTP joint 26.5 23.9 23.$RemoveBefo'5 24 24 'BvJSrxFQCQQ$ 23.2 24  Across MTP joint 25.7 24.7 23.3 23.6 23.6 23.6 23  Around proximal great toe  9 8.3 8.2 7.8 7.8 8  weight     5/31: 266.4  262  (Blank rows = not tested)   LE LANDMARK LEFT 03/05/22 Left 03/14/22 Left 03/20/22 Left  03/28/22 Left 04/04/22 Left 04/11/22 Left 04/17/22  At groin          30 cm proximal to suprapatella          20 cm proximal to suprapatella 77.5 77.5 69 70 74.5 74.5 72  10 cm proximal to suprapatella 73.8 75.3 65 65.8 67 63.8 64  At midpatella / popliteal crease 60.6 59.8 55 53.8 57.8 54 55  30 cm proximal to floor at lateral plantar foot 56.3 45.8 43.2 44.5 42.4 42 41  20 cm proximal to floor at lateral plantar foot 43.8 23 33.2 33.2 34.5 32 31  10 cm proximal to floor at lateral plantar foot 34.6 29.6 28 27.8 30.5 27.4 27.5  Circumference of ankle/heel 37.3 31.6 31.5 32 33.$RemoveBe'8 31 31  5 'LeaEaKATP$ cm proximal to 1st MTP joint 26.3 24.8 23.$RemoveBefo'3 23 24 'HUifQErcWEY$ 22.6 22.5  Across MTP joint 25    23.8 23.4 23 23.5 23 22.6  Around proximal great toe   9 8.5 8.2 8.$RemoveB'1 8 8  'lydZstHl$ (Blank rows = not tested)       TODAY'STREATMENT  04/18/22: Pt independent with bed mobilty, labored movements and increased time. Manual lymph decongestive technqiues for bil LE's including short neck, deep and superficial abdominals.  Inguinal axillary anastomosis for bil LE's completed anterior in supine, posterior complete in sidelying position. Compression bandaging full LE's to thigh using 1/2" foam and multilayer short stretch;  Thigh bandaged in standing  04/17/22  Progress note completed Pt independent with bed mobilty, labored movements and increased time. Manual lymph decongestive technqiues for bil LE's including short neck, deep and superficial abdominals.  Inguinal axillary anastomosis for bil LE's completed anterior in supine, posterior complete in sidelying position. Measurements of bil LE's Compression bandaging full LE's to thigh using 1/2" foam and multilayer short stretch;  Thighs bandaged in standing  04/15/22  Pt independent with bed mobilty, labored movements and increased time. Manual lymph decongestive technqiues for bil LE's including short neck, deep and superficial abdominals.  Inguinal axillary anastomosis for bil LE's completed anterior in supine, posterior complete in sidelying position. Compression bandaging full LE's to thigh using 1/2" foam and multilayer short stretch;  Thigh bandaged in standing  04/11/22: Pt independent with bed mobilty, labored movements and increased time. Measurements Manual lymph decongestive technqiues for bil LE's including short neck, deep and superficial abdominals.  Inguinal axillary anastomosis for bil LE's completed anterior in supine. Compression bandaging full LE's to thigh using 1/2" foam and multilayer short stretch;  Thigh bandaged in standing  04/09/22: Pt independent with bed mobilty, labored movements and increased time. Manual lymph decongestive  technqiues for bil LE's including short neck, deep and superficial abdominals.  Inguinal axillary anastomosis for bil LE's completed anterior in supine, posterior complete in sidelying position. Compression bandaging full LE's to thigh using 1/2" foam and multilayer short stretch;  Thigh bandaged in standing  04/07/22: Pt independent with bed mobilty, labored movements and increased time. Manual lymph decongestive technqiues anterior only for bil LE's including short neck, deep and superficial abdominals.  Inguinal axillary anastomosis for bil LE's completed anterior In supine Compression bandaging full LE's to thigh using 1/2" foam and multilayer short stretch;  Thigh bandaged in standing Application of mepilex sacral bandage  04/04/22: Measurements Pt independent with bed mobilty, labored movements  and increased time. Manual lymph decongestive technqiues anterior only for bil LE's including short neck, deep and superficial abdominals.  Inguinal axillary anastomosis for bil LE's completed anterior In supine Compression bandaging full LE's to thigh using 1/2" foam and multilayer short stretch;  Thigh bandaged in standing   04/02/22: Bed mobility min assist Rt LE onto mat sit <-> supine; increased time to complete Manual Lymph Drainage for bil LE's including short neck, deep and superficial abdominals.  Inguinal axillary anastomosis for bil LE's completed anterior In supine;posterior in sidelying Moisturized LE's prior to rebandaging Compression bandaging full LE's to thigh using 1/2" foam and multilayer short stretch;  Thigh bandaged in standing  04/01/22: Bed mobility min assist supine to/from sit, Rt rolling Manual Lymph Drainage for bil LE's including short neck, deep and superficial abdominals.  Inguinal axillary anastomosis for bil LE's completed anterior In supine;posterior in sidelying Moisturized LE's prior to rebandaging Compression bandaging full LE's to thigh using 1/2" foam and  multilayer short stretch; hybrid technique combination roman sandal and HAS.  Thigh bandaged in standing    03/28/22: Bed mobility increased time to complete though able to do independently Measurements Manual Lymph Drainage for bil LE's including short neck, deep and superficial abdominals.  Inguinal axillary anastomosis for bil LE's completed anterior In supine Moisturized LE's prior to rebandaging Compression bandaging full LE's to thigh using 1/2" foam and multilayer short stretch; thigh bandages in standing with short stretch on at EOS, able to ambulate 53ft with RW   03/26/22: Bed mobility min assist supine to/from sit, Rt rolling Manual Lymph Drainage for bil LE's including short neck, deep and superficial abdominals.  Inguinal axillary anastomosis for bil LE's completed anterior In supine Moisturized LE's prior to rebandaging Compression bandaging full LE's to thigh using 1/2" foam and multilayer short stretch; hybrid technique combination roman sandal and HAS.  Thigh bandaged in standing   03/24/22: Bed mobility min assist supine to/from sit, Rt rolling Manual Lymph Drainage for bil LE's including short neck, deep and superficial abdominals.  Inguinal axillary anastomosis for bil LE's completed anterior In supine Moisturized LE's prior to rebandaging Compression bandaging full LE's to thigh using 1/2" foam and multilayer short stretch; hybrid technique combination roman sandal and HAS.  Thigh bandaged in standing  03/20/22: Short stretch removal Volumetric measurements of LE's (see above) Inspected wounds, covered upper thighs with ABD, sacral wound photographed Bed mobility mod assist supine to/from sit, Rt rolling Compression bandaging full LE's to thigh using 1/2" foam and multilayer short stretch; hybrid technique combination roman sandal and HAS.  Thigh bandaged and measured in standing today with rest breaks needed               PATIENT EDUCATION:  Education details:  5/18:  See clinical impression:  need for more indepth care for pressure wounds and aggressive therapy for improving mobility 5/8:  Goals for lymphatic therapy, reminder to order short stretch compression, Informed she would be hearing from pump representative 5/3:Four aspects of treatment skin care, exercise, diaphragm breathing and lymphatic press  Person educated: Patient and friend Education method: Explanation, Demonstration, Tactile cues, Verbal cues, and Handouts Education comprehension: verbalized understanding, returned demonstration, and needs further education     HOME EXERCISE PROGRAM: ankle pumps, LAQ, hip ab/adduction, hip flexion, diaphragm breathing and lymph press.      ASSESSMENT:   CLINICAL IMPRESSION:   Manual lymph decongestive techniques complete anterior supine and posterior in sidelying.  Decreased induration noted Rt medial thigh.  Improved tolerance with standing with only 1 rest break required between legs when applying short stretch bandages to thigh with 1/2in foam.  Nira Conn stated they have purchased sacral covers that should arrive today.  Reports they plan on stopping by nursing home to pick up paperwork to complete for wound care today.  Pt plans on calling [facility] on Monday to see when to expect Juxtafit.  OBJECTIVE IMPAIRMENTS decreased activity tolerance, decreased balance, decreased mobility, difficulty walking, increased edema, pain, and skin integrithy  .    ACTIVITY LIMITATIONS cleaning, community activity, driving, laundry, and shopping.    PERSONAL FACTORS Age are also affecting patient's functional outcome.      REHAB POTENTIAL: Good   CLINICAL DECISION MAKING: Evolving/moderate complexity   EVALUATION COMPLEXITY: Moderate   GOALS: Goals reviewed with patient? Yes   SHORT TERM GOALS: Target date: 03/26/2022   Pt to have lost 3 cm in thigh area; 2 in LE to reduce risk of cellulitis  Baseline: Goal status: MET   LONG TERM GOALS: Target  date: 04/16/2022   PT to have lost 6 cm in thigh area; 3-4 in LE to reduce risk of cellulitis Baseline:  Goal status: MET   2.  Pt to be able to walk 70 ft in a minute to demonstrate improved mobility  Baseline: 2MWT complete at EOS, able to ambulate 70ft with RW and short stretch bandages on that does reduce cadence. Goal status: ONGOING   3.  PT to be completing exercises on a regular basis.  Baseline: 03/28/22:  Compliance with HEP daily Goal status: ONGOING   4.  PT to have and be using her compression pump Baseline:  received pump 03/27/22 Goal status: MET   5.  PT to have Juxtafit compression garments for her LE for maintenance phase  Baseline: 03/28/22:  Arm[facility] Sans apt Thurdsay Goal status: ONGOING   PLAN: PT FREQUENCY: 3x/week   PT DURATION: 6 weeks for an additional 2 weeks 8 weeks only    PLANNED INTERVENTIONS: Therapeutic exercises, Patient/Family education, Manual lymph drainage, and Compression bandaging   PLAN FOR NEXT SESSION:  Continue with manual and compression bandaging until receives Juxtafit. Ensure she is able to don/doff garments independently.   Ihor Austin, LPTA/CLT; Delana Meyer [phone]  04/18/2022, 11:30AM

## 2022-04-21 ENCOUNTER — Ambulatory Visit (HOSPITAL_COMMUNITY): Payer: PPO | Attending: Family Medicine | Admitting: Physical Therapy

## 2022-04-21 ENCOUNTER — Encounter (HOSPITAL_COMMUNITY): Payer: PPO | Admitting: Physical Therapy

## 2022-04-21 DIAGNOSIS — I890 Lymphedema, not elsewhere classified: Secondary | ICD-10-CM | POA: Insufficient documentation

## 2022-04-21 NOTE — Therapy (Signed)
OUTPATIENT PHYSICAL THERAPY TREATMENT NOTE     Patient Name: Megan Sutton MRN: [id number] DOB:Aug 28, 1946, 76 y.o., female Today's Date: 04/21/2022  PCP: Sharilyn Sites REFERRING PROVIDER: Collene Mares, PA-C (he is no longer there so send all further communications to Dr Hilma Favors) Progress Note Reporting Period 5/30 to 6/19  See note below for Objective Data and Assessment of Progress/Goals.     END OF SESSION:   PT End of Session - 04/21/22 1425     Visit Number 20    Number of Visits 24    Date for PT Re-Evaluation 05/02/22    Authorization Type [employer]    Progress Note Due on Visit 24    PT Start Time 1430    PT Stop Time 1545    PT Time Calculation (min) 75 min    Behavior During Therapy WFL for tasks assessed/performed                      Past Medical History:  Diagnosis Date   Cancer ([place])    Uterine cancer   Hypertension    Hypothyroidism    Past Surgical History:  Procedure Laterality Date   ABDOMINAL HYSTERECTOMY     CHOLECYSTECTOMY  1990   COLONOSCOPY N/A 05/13/2016   Procedure: COLONOSCOPY;  Surgeon: Daneil Dolin, MD;  Location: [facility];  Service: Endoscopy;  Laterality: N/A;  11:30 Am   Left inguinal hernia repair     TONSILLECTOMY     Patient Active Problem List   Diagnosis Date Noted   Diverticulosis of colon without hemorrhage     REFERRING DIAG: Lymphedema bilateral LE's  THERAPY DIAG:  Lymphedema, not elsewhere classified  PERTINENT HISTORY: Bilateral knee arthritis, Uterine Cancer with hysterectomy 1999, falls  PRECAUTIONS: Falls, cellulitis  SUBJECTIVE: Pt states that she is doing her exercises and is trying to walk as much as possible.  Pt states that her legs have not been this small since high school.                            Are you having pain? Yes 3/10 buttock  pain scale Pain location: LE with touching, pressing Pain description: aching, soreness Aggravating factors: walking, increased  activity, pressure Relieving factors: rest   OBJECTIVE: (objective measures completed at initial evaluation unless otherwise dated)       LYMPHEDEMA ASSESSMENTS:       LE LANDMARK RIGHT 03/05/22 Right  03/14/22 Right 03/20/22 Right 03/28/22 Right  04/04/22 Right 04/11/22 Right 04/17/22  At groin          30 cm proximal to suprapatella          20 cm proximal to suprapatella 87.5 88.9 80 77.2 80.4 77.8 78  10 cm proximal to suprapatella 75.8 87 69 64.5 65.5 66 67  At midpatella / popliteal crease 69 68.8 61 58 57.8 55.6 57  30 cm proximal to floor at lateral plantar foot 56.8 44.6 47 44.6 45.8 47.3 44.5  20 cm proximal to floor at lateral plantar foot 41.5 31.5 33.4 30.5 35.5 32.$RemoveBefo'8 30  10 'rgrSzhEjLOz$ cm proximal to floor at lateral plantar foot 33.4 27.8 27.2 26.5 29.$RemoveBefo'3 26 27  'toxFOtpAzBJ$ Circumference of ankle/heel 37.3 33 33 33.2 33.7 33.4 32.5  5 cm proximal to 1st MTP joint 26.5 23.9 23.$RemoveBefo'5 24 24 'gQstqXZlaaG$ 23.2 24  Across MTP joint 25.7 24.7 23.3 23.6 23.6 23.6 23  Around proximal great toe  9 8.3 8.2 7.8 7.8 8  weight     5/31: 266.4  262  (Blank rows = not tested)   LE LANDMARK LEFT 03/05/22 Left 03/14/22 Left 03/20/22 Left  03/28/22 Left 04/04/22 Left 04/11/22 Left 04/17/22  At groin          30 cm proximal to suprapatella          20 cm proximal to suprapatella 77.5 77.5 69 70 74.5 74.5 72  10 cm proximal to suprapatella 73.8 75.3 65 65.8 67 63.8 64  At midpatella / popliteal crease 60.6 59.8 55 53.8 57.8 54 55  30 cm proximal to floor at lateral plantar foot 56.3 45.8 43.2 44.5 42.4 42 41  20 cm proximal to floor at lateral plantar foot 43.8 23 33.2 33.2 34.5 32 31  10 cm proximal to floor at lateral plantar foot 34.6 29.6 28 27.8 30.5 27.4 27.5  Circumference of ankle/heel 37.3 31.6 31.5 32 33.$RemoveBe'8 31 31  5 'TxlHZXmqr$ cm proximal to 1st MTP joint 26.3 24.8 23.$RemoveBefo'3 23 24 'zZImhcQZOvr$ 22.6 22.5  Across MTP joint 25   23.8 23.4 23 23.5 23 22.6  Around proximal great toe   9 8.5 8.2 8.$RemoveB'1 8 8  'dOdQSxlU$ (Blank rows = not tested)        TODAY'STREATMENT  04/20/22: Pt  mod independent with bed mobilty, labored movements and increased time. Manual lymph decongestive technqiues for bil LE's including short neck, deep and superficial abdominals.  Inguinal axillary anastomosis for bil LE's completed anterior in supine, posterior complete in sidelying position. Compression bandaging full LE's to thigh using 1/2" foam and multilayer short stretch;  Thigh bandaged in standing  04/18/22: Pt independent with bed mobilty, labored movements and increased time. Manual lymph decongestive technqiues for bil LE's including short neck, deep and superficial abdominals.  Inguinal axillary anastomosis for bil LE's completed anterior in supine, posterior complete in sidelying position. Compression bandaging full LE's to thigh using 1/2" foam and multilayer short stretch;  Thigh bandaged in standing  04/17/22  Progress note completed Pt independent with bed mobilty, labored movements and increased time. Manual lymph decongestive technqiues for bil LE's including short neck, deep and superficial abdominals.  Inguinal axillary anastomosis for bil LE's completed anterior in supine, posterior complete in sidelying position. Measurements of bil LE's Compression bandaging full LE's to thigh using 1/2" foam and multilayer short stretch;  Thighs bandaged in standing  04/15/22  Pt independent with bed mobilty, labored movements and increased time. Manual lymph decongestive technqiues for bil LE's including short neck, deep and superficial abdominals.  Inguinal axillary anastomosis for bil LE's completed anterior in supine, posterior complete in sidelying position. Compression bandaging full LE's to thigh using 1/2" foam and multilayer short stretch;  Thigh bandaged in standing  04/11/22: Pt independent with bed mobilty, labored movements and increased time. Measurements Manual lymph decongestive technqiues for bil LE's including short neck, deep and  superficial abdominals.  Inguinal axillary anastomosis for bil LE's completed anterior in supine. Compression bandaging full LE's to thigh using 1/2" foam and multilayer short stretch;  Thigh bandaged in standing  04/09/22: Pt independent with bed mobilty, labored movements and increased time. Manual lymph decongestive technqiues for bil LE's including short neck, deep and superficial abdominals.  Inguinal axillary anastomosis for bil LE's completed anterior in supine, posterior complete in sidelying position. Compression bandaging full LE's to thigh using 1/2" foam and multilayer short stretch;  Thigh bandaged in standing  04/07/22: Pt independent with bed mobilty, labored movements and  increased time. Manual lymph decongestive technqiues anterior only for bil LE's including short neck, deep and superficial abdominals.  Inguinal axillary anastomosis for bil LE's completed anterior In supine Compression bandaging full LE's to thigh using 1/2" foam and multilayer short stretch;  Thigh bandaged in standing Application of mepilex sacral bandage  04/04/22: Measurements Pt independent with bed mobilty, labored movements and increased time. Manual lymph decongestive technqiues anterior only for bil LE's including short neck, deep and superficial abdominals.  Inguinal axillary anastomosis for bil LE's completed anterior In supine Compression bandaging full LE's to thigh using 1/2" foam and multilayer short stretch;  Thigh bandaged in standing   04/02/22: Bed mobility min assist Rt LE onto mat sit <-> supine; increased time to complete Manual Lymph Drainage for bil LE's including short neck, deep and superficial abdominals.  Inguinal axillary anastomosis for bil LE's completed anterior In supine;posterior in sidelying Moisturized LE's prior to rebandaging Compression bandaging full LE's to thigh using 1/2" foam and multilayer short stretch;  Thigh bandaged in standing  04/01/22: Bed mobility min assist  supine to/from sit, Rt rolling Manual Lymph Drainage for bil LE's including short neck, deep and superficial abdominals.  Inguinal axillary anastomosis for bil LE's completed anterior In supine;posterior in sidelying Moisturized LE's prior to rebandaging Compression bandaging full LE's to thigh using 1/2" foam and multilayer short stretch; hybrid technique combination roman sandal and HAS.  Thigh bandaged in standing    03/28/22: Bed mobility increased time to complete though able to do independently Measurements Manual Lymph Drainage for bil LE's including short neck, deep and superficial abdominals.  Inguinal axillary anastomosis for bil LE's completed anterior In supine Moisturized LE's prior to rebandaging Compression bandaging full LE's to thigh using 1/2" foam and multilayer short stretch; thigh bandages in standing 2MWT with short stretch on at EOS, able to ambulate 19ft with RW   03/26/22: Bed mobility min assist supine to/from sit, Rt rolling Manual Lymph Drainage for bil LE's including short neck, deep and superficial abdominals.  Inguinal axillary anastomosis for bil LE's completed anterior In supine Moisturized LE's prior to rebandaging Compression bandaging full LE's to thigh using 1/2" foam and multilayer short stretch; hybrid technique combination roman sandal and HAS.  Thigh bandaged in standing   03/24/22: Bed mobility min assist supine to/from sit, Rt rolling Manual Lymph Drainage for bil LE's including short neck, deep and superficial abdominals.  Inguinal axillary anastomosis for bil LE's completed anterior In supine Moisturized LE's prior to rebandaging Compression bandaging full LE's to thigh using 1/2" foam and multilayer short stretch; hybrid technique combination roman sandal and HAS.  Thigh bandaged in standing  03/20/22: Short stretch removal Volumetric measurements of LE's (see above) Inspected wounds, covered upper thighs with ABD, sacral wound photographed Bed  mobility mod assist supine to/from sit, Rt rolling Compression bandaging full LE's to thigh using 1/2" foam and multilayer short stretch; hybrid technique combination roman sandal and HAS.  Thigh bandaged and measured in standing today with rest breaks needed               PATIENT EDUCATION:  Education details: 5/18:  See clinical impression:  need for more indepth care for pressure wounds and aggressive therapy for improving mobility 5/8:  Goals for lymphatic therapy, reminder to order short stretch compression, Informed she would be hearing from pump representative 5/3:Four aspects of treatment skin care, exercise, diaphragm breathing and lymphatic press  Person educated: Patient and friend Education method: Explanation, Demonstration, Tactile cues, Verbal cues,  and Handouts Education comprehension: verbalized understanding, returned demonstration, and needs further education     HOME EXERCISE PROGRAM: ankle pumps, LAQ, hip ab/adduction, hip flexion, diaphragm breathing and lymph press.      ASSESSMENT:   CLINICAL IMPRESSION:   The only induration currently is in the posterior distal thigh Bilaterally and in the pt pannus on the RT side.  Pt called [facility] re juxtafit, however it Was the fitters day off, therefore she will call tomorrow.  OBJECTIVE IMPAIRMENTS decreased activity tolerance, decreased balance, decreased mobility, difficulty walking, increased edema, pain, and skin integrithy  .    ACTIVITY LIMITATIONS cleaning, community activity, driving, laundry, and shopping.    PERSONAL FACTORS Age are also affecting patient's functional outcome.      REHAB POTENTIAL: Good   CLINICAL DECISION MAKING: Evolving/moderate complexity   EVALUATION COMPLEXITY: Moderate   GOALS: Goals reviewed with patient? Yes   SHORT TERM GOALS: Target date: 03/26/2022   Pt to have lost 3 cm in thigh area; 2 in LE to reduce risk of cellulitis  Baseline: Goal status: MET   LONG TERM  GOALS: Target date: 04/16/2022   PT to have lost 6 cm in thigh area; 3-4 in LE to reduce risk of cellulitis Baseline:  Goal status: MET   2.  Pt to be able to walk 70 ft in a minute to demonstrate improved mobility  Baseline: 2MWT complete at EOS, able to ambulate 33ft with RW and short stretch bandages on that does reduce cadence. Goal status: ONGOING   3.  PT to be completing exercises on a regular basis.  Baseline: 03/28/22:  Compliance with HEP daily Goal status: met    4.  PT to have and be using her compression pump Baseline:  received pump 03/27/22 Goal status: MET   5.  PT to have Juxtafit compression garments for her LE for maintenance phase  Baseline: 03/28/22:  Arm[facility] Sans apt Thurdsay Goal status: ONGOING   PLAN: PT FREQUENCY: 3x/week   PT DURATION: 6 weeks for an additional 2 weeks 8 weeks only    PLANNED INTERVENTIONS: Therapeutic exercises, Patient/Family education, Manual lymph drainage, and Compression bandaging   PLAN FOR NEXT SESSION:  Continue with manual and compression bandaging until receives Juxtafit. Ensure she is able to don/doff garments independently.  Rayetta Humphrey, [place] CLT [phone] 04/21/2022,

## 2022-04-23 ENCOUNTER — Encounter (HOSPITAL_COMMUNITY): Payer: PPO

## 2022-04-23 ENCOUNTER — Ambulatory Visit (HOSPITAL_COMMUNITY): Payer: PPO | Attending: Family Medicine | Admitting: Physical Therapy

## 2022-04-23 DIAGNOSIS — I890 Lymphedema, not elsewhere classified: Secondary | ICD-10-CM | POA: Diagnosis not present

## 2022-04-23 NOTE — Therapy (Signed)
OUTPATIENT PHYSICAL THERAPY TREATMENT NOTE     Patient Name: Megan Sutton MRN: [id number] DOB:23-Jul-1946, 76 y.o., female Today's Date: 04/23/2022  PCP: Sharilyn Sites REFERRING PROVIDER: Collene Mares, PA-C (he is no longer there so send all further communications to Dr Hilma Favors) Progress Note Reporting Period 5/30 to 6/19  See note below for Objective Data and Assessment of Progress/Goals.     END OF SESSION:   PT End of Session - 04/23/22 1442     Visit Number 21    Number of Visits 24    Date for PT Re-Evaluation 05/02/22    Authorization Type [employer]    Progress Note Due on Visit 24    PT Start Time 0920    PT Stop Time 1040    PT Time Calculation (min) 80 min    Behavior During Therapy [facility] for tasks assessed/performed                       Past Medical History:  Diagnosis Date   Cancer ([place])    Uterine cancer   Hypertension    Hypothyroidism    Past Surgical History:  Procedure Laterality Date   ABDOMINAL HYSTERECTOMY     CHOLECYSTECTOMY  1990   COLONOSCOPY N/A 05/13/2016   Procedure: COLONOSCOPY;  Surgeon: Daneil Dolin, MD;  Location: [facility];  Service: Endoscopy;  Laterality: N/A;  11:30 Am   Left inguinal hernia repair     TONSILLECTOMY     Patient Active Problem List   Diagnosis Date Noted   Diverticulosis of colon without hemorrhage     REFERRING DIAG: Lymphedema bilateral LE's  THERAPY DIAG:  Lymphedema, not elsewhere classified  PERTINENT HISTORY: Bilateral knee arthritis, Uterine Cancer with hysterectomy 1999, falls  PRECAUTIONS: Falls, cellulitis  SUBJECTIVE: Pt states that she is going to [facility] following her appt here today to get her juxtafits.  Nira Conn has ordered sacral dressings for patient for trial as well.  Has not heard back from facility regarding placement.                            Are you having pain? Yes 3/10 buttock  pain scale Pain location: LE with touching, pressing Pain description:  aching, soreness Aggravating factors: walking, increased activity, pressure Relieving factors: rest   OBJECTIVE: (objective measures completed at initial evaluation unless otherwise dated)  2MWT: 04/23/22:  142 feet in 2 minutes using RW At eval:  Unable to complete; stopped at 1:30" due to fatigue.  Ambulated only 70 feet in 1.5 minutes with RW       LYMPHEDEMA ASSESSMENTS:       LE LANDMARK RIGHT 03/05/22 Right  03/14/22 Right 03/20/22 Right 03/28/22 Right  04/04/22 Right 04/11/22 Right 04/17/22 Right 04/23/22  At groin           30 cm proximal to suprapatella           20 cm proximal to suprapatella 87.5 88.9 80 77.2 80.4 77.8 78 75  10 cm proximal to suprapatella 75.8 87 69 64.5 65.5 66 67 66.8  At midpatella / popliteal crease 69 68.8 61 58 57.8 55.6 57 56  30 cm proximal to floor at lateral plantar foot 56.8 44.6 47 44.6 45.8 47.3 44.5 44  20 cm proximal to floor at lateral plantar foot 41.5 31.5 33.4 30.5 35.5 32.$RemoveBefo'8 30 30  10 'HmfKkUNDIAP$ cm proximal to floor at lateral plantar foot 33.4  27.8 27.2 26.5 29.$RemoveBefo'3 26 27 27  'FmlCfwtRIHl$ Circumference of ankle/heel 37.3 33 33 33.2 33.7 33.4 32.5 33  5 cm proximal to 1st MTP joint 26.5 23.9 23.$RemoveBefo'5 24 24 'YNpcOMuIaPA$ 23.$RemoveBefore'2 24 24  'qAEyBjRWUAQVb$ Across MTP joint 25.7 24.7 23.3 23.6 23.6 23.$RemoveBefo'6 23 23  'zDFZMUElyMb$ Around proximal great toe   9 8.3 8.2 7.8 7.$RemoveB'8 8 8  'iXSQFvBJ$ weight     5/31: 266.4  262   (Blank rows = not tested)   LE LANDMARK LEFT 03/05/22 Left 03/14/22 Left 03/20/22 Left  03/28/22 Left 04/04/22 Left 04/11/22 Left 04/17/22 Left 04/23/22  At groin           30 cm proximal to suprapatella           20 cm proximal to suprapatella 77.5 77.5 69 70 74.5 74.5 72 69  10 cm proximal to suprapatella 73.8 75.3 65 65.8 67 63.8 64 64  At midpatella / popliteal crease 60.6 59.8 55 53.8 57.8 54 55 55  30 cm proximal to floor at lateral plantar foot 56.3 45.8 43.2 44.5 42.4 42 41 41.5  20 cm proximal to floor at lateral plantar foot 43.8 23 33.2 33.2 34.5 32 $Remov'31 30  10 'VlXHHG$ cm proximal to floor at lateral plantar foot 34.6  29.6 28 27.8 30.5 27.4 27.5 27.2  Circumference of ankle/heel 37.3 31.6 31.5 32 33.$RemoveBe'8 31 31 'YcbDYvPQl$ 32  5 cm proximal to 1st MTP joint 26.3 24.8 23.$RemoveBefo'3 23 24 'kZulXrzndhs$ 22.6 22.5 23  Across MTP joint 25   23.8 23.4 23 23.5 23 22.6 22  Around proximal great toe   9 8.5 8.2 8.$RemoveB'1 8 8 8  'wsqrarJz$ (Blank rows = not tested)       TODAY'STREATMENT  04/23/22 2 MWT test completed today with 142 feet completed with RW Manual lymph drainage for bil LE's completed in anterior only including short neck, deep and superficial abdominal nodes.  Bil inguinal-axillary anastomosis completed Application of sacral dressing pads to sacrum, Lt and Rt posterior thigh wounds High bed transfer not completed secondary to safety; problem solved with patient/Heather regarding need for shorter bed/adjustments to box spring/frame Compression bandaging not applied due to going to get juxtafit Reviewed "daily leg care" sheet with patient/ CG  04/20/22: Pt  mod independent with bed mobilty, labored movements and increased time. Manual lymph decongestive technqiues for bil LE's including short neck, deep and superficial abdominals.  Inguinal axillary anastomosis for bil LE's completed anterior in supine, posterior complete in sidelying position. Compression bandaging full LE's to thigh using 1/2" foam and multilayer short stretch;  Thigh bandaged in standing  04/18/22: Pt independent with bed mobilty, labored movements and increased time. Manual lymph decongestive technqiues for bil LE's including short neck, deep and superficial abdominals.  Inguinal axillary anastomosis for bil LE's completed anterior in supine, posterior complete in sidelying position. Compression bandaging full LE's to thigh using 1/2" foam and multilayer short stretch;  Thigh bandaged in standing  04/17/22  Progress note completed Pt independent with bed mobilty, labored movements and increased time. Manual lymph decongestive technqiues for bil LE's including short neck, deep and  superficial abdominals.  Inguinal axillary anastomosis for bil LE's completed anterior in supine, posterior complete in sidelying position. Measurements of bil LE's Compression bandaging full LE's to thigh using 1/2" foam and multilayer short stretch;  Thighs bandaged in standing  04/15/22  Pt independent with bed mobilty, labored movements and increased time. Manual lymph decongestive technqiues for bil LE's including short neck, deep and superficial abdominals.  Inguinal axillary  anastomosis for bil LE's completed anterior in supine, posterior complete in sidelying position. Compression bandaging full LE's to thigh using 1/2" foam and multilayer short stretch;  Thigh bandaged in standing  04/11/22: Pt independent with bed mobilty, labored movements and increased time. Measurements Manual lymph decongestive technqiues for bil LE's including short neck, deep and superficial abdominals.  Inguinal axillary anastomosis for bil LE's completed anterior in supine. Compression bandaging full LE's to thigh using 1/2" foam and multilayer short stretch;  Thigh bandaged in standing  04/09/22: Pt independent with bed mobilty, labored movements and increased time. Manual lymph decongestive technqiues for bil LE's including short neck, deep and superficial abdominals.  Inguinal axillary anastomosis for bil LE's completed anterior in supine, posterior complete in sidelying position. Compression bandaging full LE's to thigh using 1/2" foam and multilayer short stretch;  Thigh bandaged in standing  04/07/22: Pt independent with bed mobilty, labored movements and increased time. Manual lymph decongestive technqiues anterior only for bil LE's including short neck, deep and superficial abdominals.  Inguinal axillary anastomosis for bil LE's completed anterior In supine Compression bandaging full LE's to thigh using 1/2" foam and multilayer short stretch;  Thigh bandaged in standing Application of mepilex sacral  bandage  04/04/22: Measurements Pt independent with bed mobilty, labored movements and increased time. Manual lymph decongestive technqiues anterior only for bil LE's including short neck, deep and superficial abdominals.  Inguinal axillary anastomosis for bil LE's completed anterior In supine Compression bandaging full LE's to thigh using 1/2" foam and multilayer short stretch;  Thigh bandaged in standing   04/02/22: Bed mobility min assist Rt LE onto mat sit <-> supine; increased time to complete Manual Lymph Drainage for bil LE's including short neck, deep and superficial abdominals.  Inguinal axillary anastomosis for bil LE's completed anterior In supine;posterior in sidelying Moisturized LE's prior to rebandaging Compression bandaging full LE's to thigh using 1/2" foam and multilayer short stretch;  Thigh bandaged in standing  04/01/22: Bed mobility min assist supine to/from sit, Rt rolling Manual Lymph Drainage for bil LE's including short neck, deep and superficial abdominals.  Inguinal axillary anastomosis for bil LE's completed anterior In supine;posterior in sidelying Moisturized LE's prior to rebandaging Compression bandaging full LE's to thigh using 1/2" foam and multilayer short stretch; hybrid technique combination roman sandal and HAS.  Thigh bandaged in standing    03/28/22: Bed mobility increased time to complete though able to do independently Measurements Manual Lymph Drainage for bil LE's including short neck, deep and superficial abdominals.  Inguinal axillary anastomosis for bil LE's completed anterior In supine Moisturized LE's prior to rebandaging Compression bandaging full LE's to thigh using 1/2" foam and multilayer short stretch; thigh bandages in standing 2MWT with short stretch on at EOS, able to ambulate 30ft with RW   03/26/22: Bed mobility min assist supine to/from sit, Rt rolling Manual Lymph Drainage for bil LE's including short neck, deep and superficial  abdominals.  Inguinal axillary anastomosis for bil LE's completed anterior In supine Moisturized LE's prior to rebandaging Compression bandaging full LE's to thigh using 1/2" foam and multilayer short stretch; hybrid technique combination roman sandal and HAS.  Thigh bandaged in standing   03/24/22: Bed mobility min assist supine to/from sit, Rt rolling Manual Lymph Drainage for bil LE's including short neck, deep and superficial abdominals.  Inguinal axillary anastomosis for bil LE's completed anterior In supine Moisturized LE's prior to rebandaging Compression bandaging full LE's to thigh using 1/2" foam and multilayer short stretch; hybrid technique  combination roman sandal and HAS.  Thigh bandaged in standing  03/20/22: Short stretch removal Volumetric measurements of LE's (see above) Inspected wounds, covered upper thighs with ABD, sacral wound photographed Bed mobility mod assist supine to/from sit, Rt rolling Compression bandaging full LE's to thigh using 1/2" foam and multilayer short stretch; hybrid technique combination roman sandal and HAS.  Thigh bandaged and measured in standing today with rest breaks needed               PATIENT EDUCATION:  Education details: 6/21:  safety of bed mobilty, review of "daily leg care" sheet for compression, use of dressings on pressure sores 5/18:  See clinical impression:  need for more indepth care for pressure wounds and aggressive therapy for improving mobility 5/8:  Goals for lymphatic therapy, reminder to order short stretch compression, Informed she would be hearing from pump representative 5/3:Four aspects of treatment skin care, exercise, diaphragm breathing and lymphatic press  Person educated: Patient and friend Education method: Explanation, Demonstration, Tactile cues, Verbal cues, and Handouts Education comprehension: verbalized understanding, returned demonstration, and needs further education     HOME EXERCISE PROGRAM: ankle  pumps, LAQ, hip ab/adduction, hip flexion, diaphragm breathing and lymph press.      ASSESSMENT:   CLINICAL IMPRESSION:   LE's re-measured this session with some reduction continuing through her thighs and distal LE's remaining mostly unchanged.  Manual lymph drainage completed and sacral dressings brought by friend applied to sacrum and bil posterior thighs.  Pt reported comfort with this.  Instructed pt to keep these covered and clean.  Discussed her bed at home that she describes as being up to her waist high.  Pt does verbalize the need to get up to void throughout the night,sometimes multiple times.  Upon discussion, do not feel it would be safe to return to her bed at this height even if she is able to use a step to get into/out of bed due to fall risk or sliding out of the bed.  Friend Nira Conn) and her husband are going to see what adjustments can be made to lower her bed, either removal of box springs or getting whole new frame.  Educated this will be the best way to get her pressure sores healed and will also help with her LE lymphedema.  PT to continue for donning/doffing of juxtas to ensure she is able to do this independently. See above education provided for additional information.  Did not bandage LE's today as she was going to pharmacy to get her compression bandages.   OBJECTIVE IMPAIRMENTS decreased activity tolerance, decreased balance, decreased mobility, difficulty walking, increased edema, pain, and skin integrithy  .    ACTIVITY LIMITATIONS cleaning, community activity, driving, laundry, and shopping.    PERSONAL FACTORS Age are also affecting patient's functional outcome.      REHAB POTENTIAL: Good   CLINICAL DECISION MAKING: Evolving/moderate complexity   EVALUATION COMPLEXITY: Moderate   GOALS: Goals reviewed with patient? Yes   SHORT TERM GOALS: Target date: 03/26/2022   Pt to have lost 3 cm in thigh area; 2 in LE to reduce risk of cellulitis  Baseline: Goal  status: MET   LONG TERM GOALS: Target date: 04/16/2022   PT to have lost 6 cm in thigh area; 3-4 in LE to reduce risk of cellulitis Baseline:  Goal status: MET   2.  Pt to be able to walk 70 ft in a minute to demonstrate improved mobility  Baseline: 2MWT complete at EOS,  able to ambulate 43ft with RW and short stretch bandages on that does reduce cadence. Goal status: ONGOING   3.  PT to be completing exercises on a regular basis.  Baseline: 03/28/22:  Compliance with HEP daily Goal status: met    4.  PT to have and be using her compression pump Baseline:  received pump 03/27/22 Goal status: MET   5.  PT to have Juxtafit compression garments for her LE for maintenance phase  Baseline: 03/28/22:  Armida Sans apt Thurdsay Goal status: ONGOING   PLAN: PT FREQUENCY: 3x/week   PT DURATION: 6 weeks for an additional 2 weeks 8 weeks only    PLANNED INTERVENTIONS: Therapeutic exercises, Patient/Family education, Manual lymph drainage, and Compression bandaging   PLAN FOR NEXT SESSION:  Continue with manual and compression bandaging until receives Juxtafit. Ensure she is able to don/doff garments independently.  Teena Irani, PTA/CLT [place] Ph: [phone]  [phone] 04/23/2022,

## 2022-04-24 ENCOUNTER — Encounter (HOSPITAL_COMMUNITY): Payer: Self-pay

## 2022-04-24 ENCOUNTER — Ambulatory Visit (HOSPITAL_COMMUNITY): Payer: PPO

## 2022-04-24 DIAGNOSIS — I890 Lymphedema, not elsewhere classified: Secondary | ICD-10-CM | POA: Diagnosis not present

## 2022-04-24 NOTE — Therapy (Signed)
OUTPATIENT PHYSICAL THERAPY TREATMENT NOTE     Patient Name: Megan Sutton MRN: [id number] DOB:1946-01-23, 76 y.o., female Today's Date: 04/24/2022  PCP: Sharilyn Sites REFERRING PROVIDER: Collene Mares, PA-C (he is no longer there so send all further communications to Dr Hilma Favors)   [place]:   PT End of Session - 04/24/22 1312     Visit Number 22    Number of Visits 24    Date for PT Re-Evaluation 05/02/22    Authorization Type [employer]    Authorization - Visit Number 21    Progress Note Due on Visit 24    PT Start Time 0917    PT Stop Time 1053    PT Time Calculation (min) 96 min    Activity Tolerance Patient tolerated treatment well    Behavior During Therapy [facility] for tasks assessed/performed                       Past Medical History:  Diagnosis Date   Cancer ([place])    Uterine cancer   Hypertension    Hypothyroidism    Past Surgical History:  Procedure Laterality Date   ABDOMINAL HYSTERECTOMY     CHOLECYSTECTOMY  1990   COLONOSCOPY N/A 05/13/2016   Procedure: COLONOSCOPY;  Surgeon: Daneil Dolin, MD;  Location: [facility];  Service: Endoscopy;  Laterality: N/A;  11:30 Am   Left inguinal hernia repair     TONSILLECTOMY     Patient Active Problem List   Diagnosis Date Noted   Diverticulosis of colon without hemorrhage     REFERRING DIAG: Lymphedema bilateral LE's  THERAPY DIAG:  Lymphedema, not elsewhere classified  PERTINENT HISTORY: Bilateral knee arthritis, Uterine Cancer with hysterectomy 1999, falls  PRECAUTIONS: Falls, cellulitis  SUBJECTIVE:  Pt arrived wearing Juxtafit, stated she slept in it last night.  Buttock pain scale 2/10 today.                            Are you having pain? Yes 3/10 buttock  pain scale Pain location: LE with touching, pressing Pain description: aching, soreness Aggravating factors: walking, increased activity, pressure Relieving factors: rest   OBJECTIVE: (objective measures completed  at initial evaluation unless otherwise dated)  2MWT: 04/23/22:  142 feet in 2 minutes using RW At eval:  Unable to complete; stopped at 1:30" due to fatigue.  Ambulated only 70 feet in 1.5 minutes with RW       LYMPHEDEMA ASSESSMENTS:       LE LANDMARK RIGHT 03/05/22 Right  03/14/22 Right 03/20/22 Right 03/28/22 Right  04/04/22 Right 04/11/22 Right 04/17/22 Right 04/23/22  At groin           30 cm proximal to suprapatella           20 cm proximal to suprapatella 87.5 88.9 80 77.2 80.4 77.8 78 75  10 cm proximal to suprapatella 75.8 87 69 64.5 65.5 66 67 66.8  At midpatella / popliteal crease 69 68.8 61 58 57.8 55.6 57 56  30 cm proximal to floor at lateral plantar foot 56.8 44.6 47 44.6 45.8 47.3 44.5 44  20 cm proximal to floor at lateral plantar foot 41.5 31.5 33.4 30.5 35.5 32._0 cm proximal to floor at lateral plantar foot 33.4 27.8 27.2 26.5 29._1 Circumference of ankle/heel 37.3 33 33 33.2 33.7 33.4 32.5 33  5 cm proximal  to 1st MTP joint 26.5 23.9 23._0 23._1 Across MTP joint 25.7 24.7 23.3 23.6 23.6 23._2 Around proximal great toe   9 8.3 8.2 7.8 7._3 weight     5/31: 266.4  262   (Blank rows = not tested)   LE LANDMARK LEFT 03/05/22 Left 03/14/22 Left 03/20/22 Left  03/28/22 Left 04/04/22 Left 04/11/22 Left 04/17/22 Left 04/23/22  At groin           30 cm proximal to suprapatella           20 cm proximal to suprapatella 77.5 77.5 69 70 74.5 74.5 72 69  10 cm proximal to suprapatella 73.8 75.3 65 65.8 67 63.8 64 64  At midpatella / popliteal crease 60.6 59.8 55 53.8 57.8 54 55 55  30 cm proximal to floor at lateral plantar foot 56.3 45.8 43.2 44.5 42.4 42 41 41.5  20 cm proximal to floor at lateral plantar foot 43.8 23 33.2 33.2 34.5 32 _4 cm proximal to floor at lateral plantar foot 34.6 29.6 28 27.8 30.5 27.4 27.5 27.2  Circumference of ankle/heel 37.3 31.6 31.5 32 33._5 32  5 cm proximal to 1st MTP joint 26.3 24.8 23._6 22.6 22.5 23  Across MTP joint 25   23.8 23.4 23 23.5 23 22.6 22  Around proximal great toe   9 8.5 8.2 8._7 (Blank rows = not tested)       TODAY'STREATMENT  04/24/22: [place]: pt independently removed Manual lymph drainage anterior only for time to instruct donning Donning:  Instructed placement and donning, able to complete mod I with some assistance for placement  04/23/22 2 MWT test completed today with 142 feet completed with RW Manual lymph drainage for bil LE's completed in anterior only including short neck, deep and superficial abdominal nodes.  Bil inguinal-axillary anastomosis completed Application of sacral dressing pads to sacrum, Lt and Rt posterior thigh wounds High bed transfer not completed secondary to safety; problem solved with patient/Megan Sutton regarding need for shorter bed/adjustments to box spring/frame Compression bandaging not applied due to going to get juxtafit Reviewed "daily leg care" sheet with patient/ CG  04/20/22: Pt  mod independent with bed mobilty, labored movements and increased time. Manual lymph decongestive technqiues for bil LE's including short neck, deep and superficial abdominals.  Inguinal axillary anastomosis for bil LE's completed anterior in supine, posterior complete in sidelying position. Compression bandaging full LE's to thigh using 1/2" foam and multilayer short stretch;  Thigh bandaged in standing  04/18/22: Pt independent with bed mobilty, labored movements and increased time. Manual lymph decongestive technqiues for bil LE's including short neck, deep and superficial abdominals.  Inguinal axillary anastomosis for bil LE's completed anterior in supine, posterior complete in sidelying position. Compression bandaging full LE's to thigh using 1/2" foam and multilayer short stretch;  Thigh bandaged in standing  04/17/22  Progress note completed Pt independent with bed mobilty, labored movements and increased time. Manual lymph  decongestive technqiues for bil LE's including short neck, deep and superficial abdominals.  Inguinal axillary anastomosis for bil LE's completed anterior in supine, posterior complete in sidelying position. Measurements of bil LE's Compression bandaging full LE's to thigh using 1/2" foam and multilayer short stretch;  Thighs bandaged in standing  04/15/22  Pt independent with bed mobilty, labored movements and increased time. Manual lymph decongestive technqiues for bil LE's including  short neck, deep and superficial abdominals.  Inguinal axillary anastomosis for bil LE's completed anterior in supine, posterior complete in sidelying position. Compression bandaging full LE's to thigh using 1/2" foam and multilayer short stretch;  Thigh bandaged in standing  04/11/22: Pt independent with bed mobilty, labored movements and increased time. Measurements Manual lymph decongestive technqiues for bil LE's including short neck, deep and superficial abdominals.  Inguinal axillary anastomosis for bil LE's completed anterior in supine. Compression bandaging full LE's to thigh using 1/2" foam and multilayer short stretch;  Thigh bandaged in standing  04/09/22: Pt independent with bed mobilty, labored movements and increased time. Manual lymph decongestive technqiues for bil LE's including short neck, deep and superficial abdominals.  Inguinal axillary anastomosis for bil LE's completed anterior in supine, posterior complete in sidelying position. Compression bandaging full LE's to thigh using 1/2" foam and multilayer short stretch;  Thigh bandaged in standing  04/07/22: Pt independent with bed mobilty, labored movements and increased time. Manual lymph decongestive technqiues anterior only for bil LE's including short neck, deep and superficial abdominals.  Inguinal axillary anastomosis for bil LE's completed anterior In supine Compression bandaging full LE's to thigh using 1/2" foam and multilayer short  stretch;  Thigh bandaged in standing Application of mepilex sacral bandage  04/04/22: Measurements Pt independent with bed mobilty, labored movements and increased time. Manual lymph decongestive technqiues anterior only for bil LE's including short neck, deep and superficial abdominals.  Inguinal axillary anastomosis for bil LE's completed anterior In supine Compression bandaging full LE's to thigh using 1/2" foam and multilayer short stretch;  Thigh bandaged in standing   04/02/22: Bed mobility min assist Rt LE onto mat sit <-> supine; increased time to complete Manual Lymph Drainage for bil LE's including short neck, deep and superficial abdominals.  Inguinal axillary anastomosis for bil LE's completed anterior In supine;posterior in sidelying Moisturized LE's prior to rebandaging Compression bandaging full LE's to thigh using 1/2" foam and multilayer short stretch;  Thigh bandaged in standing  04/01/22: Bed mobility min assist supine to/from sit, Rt rolling Manual Lymph Drainage for bil LE's including short neck, deep and superficial abdominals.  Inguinal axillary anastomosis for bil LE's completed anterior In supine;posterior in sidelying Moisturized LE's prior to rebandaging Compression bandaging full LE's to thigh using 1/2" foam and multilayer short stretch; hybrid technique combination roman sandal and HAS.  Thigh bandaged in standing    03/28/22: Bed mobility increased time to complete though able to do independently Measurements Manual Lymph Drainage for bil LE's including short neck, deep and superficial abdominals.  Inguinal axillary anastomosis for bil LE's completed anterior In supine Moisturized LE's prior to rebandaging Compression bandaging full LE's to thigh using 1/2" foam and multilayer short stretch; thigh bandages in standing 2MWT with short stretch on at EOS, able to ambulate 68f with RW   03/26/22: Bed mobility min assist supine to/from sit, Rt rolling Manual  Lymph Drainage for bil LE's including short neck, deep and superficial abdominals.  Inguinal axillary anastomosis for bil LE's completed anterior In supine Moisturized LE's prior to rebandaging Compression bandaging full LE's to thigh using 1/2" foam and multilayer short stretch; hybrid technique combination roman sandal and HAS.  Thigh bandaged in standing   03/24/22: Bed mobility min assist supine to/from sit, Rt rolling Manual Lymph Drainage for bil LE's including short neck, deep and superficial abdominals.  Inguinal axillary anastomosis for bil LE's completed anterior In supine Moisturized LE's prior to rebandaging Compression bandaging full LE's to thigh  using 1/2" foam and multilayer short stretch; hybrid technique combination roman sandal and HAS.  Thigh bandaged in standing  03/20/22: Short stretch removal Volumetric measurements of LE's (see above) Inspected wounds, covered upper thighs with ABD, sacral wound photographed Bed mobility mod assist supine to/from sit, Rt rolling Compression bandaging full LE's to thigh using 1/2" foam and multilayer short stretch; hybrid technique combination roman sandal and HAS.  Thigh bandaged and measured in standing today with rest breaks needed               PATIENT EDUCATION:  Education details: 6/21:  safety of bed mobilty, review of "daily leg care" sheet for compression, use of dressings on pressure sores 5/18:  See clinical impression:  need for more indepth care for pressure wounds and aggressive therapy for improving mobility 5/8:  Goals for lymphatic therapy, reminder to order short stretch compression, Informed she would be hearing from pump representative 5/3:Four aspects of treatment skin care, exercise, diaphragm breathing and lymphatic press  Person educated: Patient and friend Education method: Explanation, Demonstration, Tactile cues, Verbal cues, and Handouts Education comprehension: verbalized understanding, returned demonstration,  and needs further education     HOME EXERCISE PROGRAM: ankle pumps, LAQ, hip ab/adduction, hip flexion, diaphragm breathing and lymph press.      ASSESSMENT:   CLINICAL IMPRESSION:    Pt arrived wearing Juxtafit, able to remove independently, did need shoe horn to remove socks.  Pt instructed donning Juxtafit, therapist numbered articles to assure correct sequence with placement.  Some cueing required with foot placement and to assure thigh wrap high enough.  Pt able to demonstrate appropriate tightness with measurement board in her pocket.  Pt to continue therapy to assure able to donn correctly, will callback next week if feels able to do correctly without questions.  Megan Sutton reports they are waiting on call back to receive paperwork for placement for buttocks wound care.  OBJECTIVE IMPAIRMENTS decreased activity tolerance, decreased balance, decreased mobility, difficulty walking, increased edema, pain, and skin integrithy  .    ACTIVITY LIMITATIONS cleaning, community activity, driving, laundry, and shopping.    PERSONAL FACTORS Age are also affecting patient's functional outcome.      REHAB POTENTIAL: Good   CLINICAL DECISION MAKING: Evolving/moderate complexity   EVALUATION COMPLEXITY: Moderate   GOALS: Goals reviewed with patient? Yes   SHORT TERM GOALS: Target date: 03/26/2022   Pt to have lost 3 cm in thigh area; 2 in LE to reduce risk of cellulitis  Baseline: Goal status: MET   LONG TERM GOALS: Target date: 04/16/2022   PT to have lost 6 cm in thigh area; 3-4 in LE to reduce risk of cellulitis Baseline:  Goal status: MET   2.  Pt to be able to walk 70 ft in a minute to demonstrate improved mobility  Baseline: 2MWT complete at EOS, able to ambulate 49f with RW and short stretch bandages on that does reduce cadence. Goal status: ONGOING   3.  PT to be completing exercises on a regular basis.  Baseline: 03/28/22:  Compliance with HEP daily Goal status: met    4.   PT to have and be using her compression pump Baseline:  received pump 03/27/22 Goal status: MET   5.  PT to have Juxtafit compression garments for her LE for maintenance phase  Baseline: 03/28/22:  Megan Sansapt Thurdsay Goal status: ONGOING   PLAN: PT FREQUENCY: 3x/week   PT DURATION: 6 weeks for an additional 2 weeks 8 weeks only  PLANNED INTERVENTIONS: Therapeutic exercises, Patient/Family education, Manual lymph drainage, and Compression bandaging   PLAN FOR NEXT SESSION:   Ensure she is able to don/doff juxtafit independently.  Ihor Austin, LPTA/CLT; Delana Meyer [phone]  [phone] 04/24/2022,

## 2022-04-25 ENCOUNTER — Encounter (HOSPITAL_COMMUNITY): Payer: PPO

## 2022-04-25 DIAGNOSIS — H5212 Myopia, left eye: Secondary | ICD-10-CM | POA: Diagnosis not present

## 2022-04-25 DIAGNOSIS — H524 Presbyopia: Secondary | ICD-10-CM | POA: Diagnosis not present

## 2022-04-25 DIAGNOSIS — H52223 Regular astigmatism, bilateral: Secondary | ICD-10-CM | POA: Diagnosis not present

## 2022-04-25 DIAGNOSIS — B0052 Herpesviral keratitis: Secondary | ICD-10-CM | POA: Diagnosis not present

## 2022-04-25 DIAGNOSIS — H02054 Trichiasis without entropian left upper eyelid: Secondary | ICD-10-CM | POA: Diagnosis not present

## 2022-04-25 DIAGNOSIS — H16321 Diffuse interstitial keratitis, right eye: Secondary | ICD-10-CM | POA: Diagnosis not present

## 2022-04-28 ENCOUNTER — Encounter (HOSPITAL_COMMUNITY): Payer: PPO | Admitting: Physical Therapy

## 2022-04-29 ENCOUNTER — Ambulatory Visit (HOSPITAL_COMMUNITY): Payer: PPO | Admitting: Physical Therapy

## 2022-04-29 DIAGNOSIS — I890 Lymphedema, not elsewhere classified: Secondary | ICD-10-CM | POA: Diagnosis not present

## 2022-04-30 ENCOUNTER — Encounter (HOSPITAL_COMMUNITY): Payer: PPO

## 2022-05-01 ENCOUNTER — Encounter (HOSPITAL_COMMUNITY): Payer: PPO

## 2022-05-02 ENCOUNTER — Encounter (HOSPITAL_COMMUNITY): Payer: PPO | Admitting: Physical Therapy

## 2022-05-13 DIAGNOSIS — I872 Venous insufficiency (chronic) (peripheral): Secondary | ICD-10-CM | POA: Diagnosis not present

## 2022-05-13 DIAGNOSIS — I890 Lymphedema, not elsewhere classified: Secondary | ICD-10-CM | POA: Diagnosis not present

## 2022-05-13 DIAGNOSIS — Z7409 Other reduced mobility: Secondary | ICD-10-CM | POA: Diagnosis not present

## 2022-05-13 DIAGNOSIS — Z0001 Encounter for general adult medical examination with abnormal findings: Secondary | ICD-10-CM | POA: Diagnosis not present

## 2022-05-13 DIAGNOSIS — Z1331 Encounter for screening for depression: Secondary | ICD-10-CM | POA: Diagnosis not present

## 2022-05-13 DIAGNOSIS — M1991 Primary osteoarthritis, unspecified site: Secondary | ICD-10-CM | POA: Diagnosis not present

## 2022-05-13 DIAGNOSIS — E039 Hypothyroidism, unspecified: Secondary | ICD-10-CM | POA: Diagnosis not present

## 2022-05-13 DIAGNOSIS — Z6841 Body Mass Index (BMI) 40.0 and over, adult: Secondary | ICD-10-CM | POA: Diagnosis not present

## 2022-05-13 DIAGNOSIS — M159 Polyosteoarthritis, unspecified: Secondary | ICD-10-CM | POA: Diagnosis not present

## 2022-05-21 DIAGNOSIS — I890 Lymphedema, not elsewhere classified: Secondary | ICD-10-CM | POA: Diagnosis not present

## 2022-06-02 DIAGNOSIS — I10 Essential (primary) hypertension: Secondary | ICD-10-CM | POA: Diagnosis not present

## 2022-06-02 DIAGNOSIS — E782 Mixed hyperlipidemia: Secondary | ICD-10-CM | POA: Diagnosis not present

## 2022-06-02 DIAGNOSIS — E039 Hypothyroidism, unspecified: Secondary | ICD-10-CM | POA: Diagnosis not present

## 2022-06-06 DIAGNOSIS — H52223 Regular astigmatism, bilateral: Secondary | ICD-10-CM | POA: Diagnosis not present

## 2022-06-06 DIAGNOSIS — H43811 Vitreous degeneration, right eye: Secondary | ICD-10-CM | POA: Diagnosis not present

## 2022-06-06 DIAGNOSIS — H524 Presbyopia: Secondary | ICD-10-CM | POA: Diagnosis not present

## 2022-06-06 DIAGNOSIS — H5212 Myopia, left eye: Secondary | ICD-10-CM | POA: Diagnosis not present

## 2022-06-06 DIAGNOSIS — H43391 Other vitreous opacities, right eye: Secondary | ICD-10-CM | POA: Diagnosis not present

## 2022-07-24 DIAGNOSIS — H0279 Other degenerative disorders of eyelid and periocular area: Secondary | ICD-10-CM | POA: Diagnosis not present

## 2022-07-24 DIAGNOSIS — H02413 Mechanical ptosis of bilateral eyelids: Secondary | ICD-10-CM | POA: Diagnosis not present

## 2022-07-24 DIAGNOSIS — H02042 Spastic entropion of right lower eyelid: Secondary | ICD-10-CM | POA: Diagnosis not present

## 2022-07-24 DIAGNOSIS — H02834 Dermatochalasis of left upper eyelid: Secondary | ICD-10-CM | POA: Diagnosis not present

## 2022-07-24 DIAGNOSIS — H02035 Senile entropion of left lower eyelid: Secondary | ICD-10-CM | POA: Diagnosis not present

## 2022-07-24 DIAGNOSIS — L281 Prurigo nodularis: Secondary | ICD-10-CM | POA: Diagnosis not present

## 2022-07-24 DIAGNOSIS — H02831 Dermatochalasis of right upper eyelid: Secondary | ICD-10-CM | POA: Diagnosis not present

## 2022-07-24 DIAGNOSIS — H02052 Trichiasis without entropian right lower eyelid: Secondary | ICD-10-CM | POA: Diagnosis not present

## 2022-07-24 DIAGNOSIS — H02532 Eyelid retraction right lower eyelid: Secondary | ICD-10-CM | POA: Diagnosis not present

## 2022-07-24 DIAGNOSIS — H02032 Senile entropion of right lower eyelid: Secondary | ICD-10-CM | POA: Diagnosis not present

## 2022-08-02 DIAGNOSIS — E782 Mixed hyperlipidemia: Secondary | ICD-10-CM | POA: Diagnosis not present

## 2022-08-02 DIAGNOSIS — E039 Hypothyroidism, unspecified: Secondary | ICD-10-CM | POA: Diagnosis not present

## 2022-08-02 DIAGNOSIS — I10 Essential (primary) hypertension: Secondary | ICD-10-CM | POA: Diagnosis not present

## 2022-08-14 DIAGNOSIS — Z23 Encounter for immunization: Secondary | ICD-10-CM | POA: Diagnosis not present

## 2022-10-02 DIAGNOSIS — I10 Essential (primary) hypertension: Secondary | ICD-10-CM | POA: Diagnosis not present

## 2022-10-02 DIAGNOSIS — E782 Mixed hyperlipidemia: Secondary | ICD-10-CM | POA: Diagnosis not present

## 2022-10-02 DIAGNOSIS — E039 Hypothyroidism, unspecified: Secondary | ICD-10-CM | POA: Diagnosis not present

## 2022-10-07 DIAGNOSIS — L989 Disorder of the skin and subcutaneous tissue, unspecified: Secondary | ICD-10-CM | POA: Diagnosis not present

## 2022-10-07 DIAGNOSIS — H02532 Eyelid retraction right lower eyelid: Secondary | ICD-10-CM | POA: Diagnosis not present

## 2022-10-07 DIAGNOSIS — H11242 Scarring of conjunctiva, left eye: Secondary | ICD-10-CM | POA: Diagnosis not present

## 2022-10-07 DIAGNOSIS — H02032 Senile entropion of right lower eyelid: Secondary | ICD-10-CM | POA: Diagnosis not present

## 2022-10-07 DIAGNOSIS — L281 Prurigo nodularis: Secondary | ICD-10-CM | POA: Diagnosis not present

## 2022-10-07 DIAGNOSIS — H02052 Trichiasis without entropian right lower eyelid: Secondary | ICD-10-CM | POA: Diagnosis not present

## 2022-10-07 DIAGNOSIS — H02042 Spastic entropion of right lower eyelid: Secondary | ICD-10-CM | POA: Diagnosis not present

## 2022-10-07 DIAGNOSIS — H02035 Senile entropion of left lower eyelid: Secondary | ICD-10-CM | POA: Diagnosis not present

## 2022-10-07 DIAGNOSIS — H02834 Dermatochalasis of left upper eyelid: Secondary | ICD-10-CM | POA: Diagnosis not present

## 2022-10-07 DIAGNOSIS — H0279 Other degenerative disorders of eyelid and periocular area: Secondary | ICD-10-CM | POA: Diagnosis not present

## 2022-10-07 DIAGNOSIS — H02831 Dermatochalasis of right upper eyelid: Secondary | ICD-10-CM | POA: Diagnosis not present

## 2022-10-07 DIAGNOSIS — H02413 Mechanical ptosis of bilateral eyelids: Secondary | ICD-10-CM | POA: Diagnosis not present

## 2022-12-03 DIAGNOSIS — E039 Hypothyroidism, unspecified: Secondary | ICD-10-CM | POA: Diagnosis not present

## 2022-12-03 DIAGNOSIS — E782 Mixed hyperlipidemia: Secondary | ICD-10-CM | POA: Diagnosis not present

## 2022-12-03 DIAGNOSIS — I10 Essential (primary) hypertension: Secondary | ICD-10-CM | POA: Diagnosis not present

## 2022-12-12 DIAGNOSIS — L138 Other specified bullous disorders: Secondary | ICD-10-CM | POA: Diagnosis not present

## 2022-12-12 DIAGNOSIS — Z79899 Other long term (current) drug therapy: Secondary | ICD-10-CM | POA: Diagnosis not present

## 2022-12-15 DIAGNOSIS — H53483 Generalized contraction of visual field, bilateral: Secondary | ICD-10-CM | POA: Diagnosis not present

## 2022-12-18 DIAGNOSIS — L138 Other specified bullous disorders: Secondary | ICD-10-CM | POA: Diagnosis not present

## 2022-12-18 DIAGNOSIS — L718 Other rosacea: Secondary | ICD-10-CM | POA: Diagnosis not present

## 2023-01-06 DIAGNOSIS — H02011 Cicatricial entropion of right upper eyelid: Secondary | ICD-10-CM | POA: Diagnosis not present

## 2023-01-06 DIAGNOSIS — H02021 Mechanical entropion of right upper eyelid: Secondary | ICD-10-CM | POA: Diagnosis not present

## 2023-01-06 DIAGNOSIS — H02831 Dermatochalasis of right upper eyelid: Secondary | ICD-10-CM | POA: Diagnosis not present

## 2023-01-06 DIAGNOSIS — H02834 Dermatochalasis of left upper eyelid: Secondary | ICD-10-CM | POA: Diagnosis not present

## 2023-01-06 DIAGNOSIS — H02014 Cicatricial entropion of left upper eyelid: Secondary | ICD-10-CM | POA: Diagnosis not present

## 2023-01-06 DIAGNOSIS — H02024 Mechanical entropion of left upper eyelid: Secondary | ICD-10-CM | POA: Diagnosis not present

## 2023-04-13 DIAGNOSIS — Z79899 Other long term (current) drug therapy: Secondary | ICD-10-CM | POA: Diagnosis not present

## 2023-04-13 DIAGNOSIS — L138 Other specified bullous disorders: Secondary | ICD-10-CM | POA: Diagnosis not present

## 2023-05-04 DIAGNOSIS — H02112 Cicatricial ectropion of right lower eyelid: Secondary | ICD-10-CM | POA: Diagnosis not present

## 2023-05-04 DIAGNOSIS — H02834 Dermatochalasis of left upper eyelid: Secondary | ICD-10-CM | POA: Diagnosis not present

## 2023-05-04 DIAGNOSIS — H57813 Brow ptosis, bilateral: Secondary | ICD-10-CM | POA: Diagnosis not present

## 2023-05-04 DIAGNOSIS — L281 Prurigo nodularis: Secondary | ICD-10-CM | POA: Diagnosis not present

## 2023-05-04 DIAGNOSIS — H02831 Dermatochalasis of right upper eyelid: Secondary | ICD-10-CM | POA: Diagnosis not present

## 2023-05-04 DIAGNOSIS — H0279 Other degenerative disorders of eyelid and periocular area: Secondary | ICD-10-CM | POA: Diagnosis not present

## 2023-05-04 DIAGNOSIS — Z09 Encounter for follow-up examination after completed treatment for conditions other than malignant neoplasm: Secondary | ICD-10-CM | POA: Diagnosis not present

## 2023-05-04 DIAGNOSIS — H04201 Unspecified epiphora, right lacrimal gland: Secondary | ICD-10-CM | POA: Diagnosis not present

## 2023-05-18 DIAGNOSIS — E782 Mixed hyperlipidemia: Secondary | ICD-10-CM | POA: Diagnosis not present

## 2023-05-18 DIAGNOSIS — E039 Hypothyroidism, unspecified: Secondary | ICD-10-CM | POA: Diagnosis not present

## 2023-05-18 DIAGNOSIS — Z6841 Body Mass Index (BMI) 40.0 and over, adult: Secondary | ICD-10-CM | POA: Diagnosis not present

## 2023-05-18 DIAGNOSIS — I872 Venous insufficiency (chronic) (peripheral): Secondary | ICD-10-CM | POA: Diagnosis not present

## 2023-05-18 DIAGNOSIS — M170 Bilateral primary osteoarthritis of knee: Secondary | ICD-10-CM | POA: Diagnosis not present

## 2023-05-18 DIAGNOSIS — E662 Morbid (severe) obesity with alveolar hypoventilation: Secondary | ICD-10-CM | POA: Diagnosis not present

## 2023-05-18 DIAGNOSIS — I890 Lymphedema, not elsewhere classified: Secondary | ICD-10-CM | POA: Diagnosis not present

## 2023-05-18 DIAGNOSIS — E7849 Other hyperlipidemia: Secondary | ICD-10-CM | POA: Diagnosis not present

## 2023-05-18 DIAGNOSIS — I10 Essential (primary) hypertension: Secondary | ICD-10-CM | POA: Diagnosis not present

## 2023-05-18 DIAGNOSIS — Z0001 Encounter for general adult medical examination with abnormal findings: Secondary | ICD-10-CM | POA: Diagnosis not present

## 2023-05-18 DIAGNOSIS — Z1331 Encounter for screening for depression: Secondary | ICD-10-CM | POA: Diagnosis not present

## 2023-05-18 DIAGNOSIS — M159 Polyosteoarthritis, unspecified: Secondary | ICD-10-CM | POA: Diagnosis not present

## 2023-06-17 DIAGNOSIS — L281 Prurigo nodularis: Secondary | ICD-10-CM | POA: Diagnosis not present

## 2023-06-17 DIAGNOSIS — H0289 Other specified disorders of eyelid: Secondary | ICD-10-CM | POA: Diagnosis not present

## 2023-06-17 DIAGNOSIS — H02052 Trichiasis without entropian right lower eyelid: Secondary | ICD-10-CM | POA: Diagnosis not present

## 2023-06-17 DIAGNOSIS — H02834 Dermatochalasis of left upper eyelid: Secondary | ICD-10-CM | POA: Diagnosis not present

## 2023-06-17 DIAGNOSIS — H57813 Brow ptosis, bilateral: Secondary | ICD-10-CM | POA: Diagnosis not present

## 2023-06-17 DIAGNOSIS — Z09 Encounter for follow-up examination after completed treatment for conditions other than malignant neoplasm: Secondary | ICD-10-CM | POA: Diagnosis not present

## 2023-06-17 DIAGNOSIS — H02831 Dermatochalasis of right upper eyelid: Secondary | ICD-10-CM | POA: Diagnosis not present

## 2023-06-17 DIAGNOSIS — H04201 Unspecified epiphora, right lacrimal gland: Secondary | ICD-10-CM | POA: Diagnosis not present

## 2023-06-17 DIAGNOSIS — H02112 Cicatricial ectropion of right lower eyelid: Secondary | ICD-10-CM | POA: Diagnosis not present

## 2023-06-17 DIAGNOSIS — H0279 Other degenerative disorders of eyelid and periocular area: Secondary | ICD-10-CM | POA: Diagnosis not present

## 2023-09-11 DIAGNOSIS — Z23 Encounter for immunization: Secondary | ICD-10-CM | POA: Diagnosis not present

## 2024-05-18 DIAGNOSIS — R300 Dysuria: Secondary | ICD-10-CM | POA: Diagnosis not present

## 2024-05-18 DIAGNOSIS — E662 Morbid (severe) obesity with alveolar hypoventilation: Secondary | ICD-10-CM | POA: Diagnosis not present

## 2024-05-18 DIAGNOSIS — M159 Polyosteoarthritis, unspecified: Secondary | ICD-10-CM | POA: Diagnosis not present

## 2024-05-18 DIAGNOSIS — Z6841 Body Mass Index (BMI) 40.0 and over, adult: Secondary | ICD-10-CM | POA: Diagnosis not present

## 2024-05-18 DIAGNOSIS — I872 Venous insufficiency (chronic) (peripheral): Secondary | ICD-10-CM | POA: Diagnosis not present

## 2024-05-18 DIAGNOSIS — Z7409 Other reduced mobility: Secondary | ICD-10-CM | POA: Diagnosis not present

## 2024-05-18 DIAGNOSIS — Z0001 Encounter for general adult medical examination with abnormal findings: Secondary | ICD-10-CM | POA: Diagnosis not present

## 2024-05-18 DIAGNOSIS — E782 Mixed hyperlipidemia: Secondary | ICD-10-CM | POA: Diagnosis not present

## 2024-05-18 DIAGNOSIS — Z1331 Encounter for screening for depression: Secondary | ICD-10-CM | POA: Diagnosis not present

## 2024-05-18 DIAGNOSIS — E7849 Other hyperlipidemia: Secondary | ICD-10-CM | POA: Diagnosis not present

## 2024-05-18 DIAGNOSIS — E039 Hypothyroidism, unspecified: Secondary | ICD-10-CM | POA: Diagnosis not present

## 2024-05-18 DIAGNOSIS — I890 Lymphedema, not elsewhere classified: Secondary | ICD-10-CM | POA: Diagnosis not present
